# Patient Record
Sex: Female | Born: 1935 | Race: White | Hispanic: No | Smoking: Never smoker
Health system: Southern US, Community
[De-identification: ages and names within clinical notes are randomized; demographics above are authoritative.]

## PROBLEM LIST (undated history)

## (undated) DIAGNOSIS — I10 Essential (primary) hypertension: Secondary | ICD-10-CM

## (undated) DIAGNOSIS — F0390 Unspecified dementia without behavioral disturbance: Secondary | ICD-10-CM

## (undated) DIAGNOSIS — I509 Heart failure, unspecified: Secondary | ICD-10-CM

---

## 1999-09-26 ENCOUNTER — Encounter: Payer: Self-pay | Admitting: Family Medicine

## 1999-09-26 ENCOUNTER — Ambulatory Visit (HOSPITAL_COMMUNITY): Admission: RE | Admit: 1999-09-26 | Discharge: 1999-09-26 | Payer: Self-pay | Admitting: Family Medicine

## 1999-10-06 ENCOUNTER — Encounter: Payer: Self-pay | Admitting: Neurosurgery

## 1999-10-07 ENCOUNTER — Encounter: Payer: Self-pay | Admitting: Neurosurgery

## 1999-10-07 ENCOUNTER — Inpatient Hospital Stay (HOSPITAL_COMMUNITY): Admission: RE | Admit: 1999-10-07 | Discharge: 1999-10-08 | Payer: Self-pay | Admitting: Neurosurgery

## 2002-04-21 ENCOUNTER — Ambulatory Visit (HOSPITAL_BASED_OUTPATIENT_CLINIC_OR_DEPARTMENT_OTHER): Admission: RE | Admit: 2002-04-21 | Discharge: 2002-04-21 | Payer: Self-pay | Admitting: Neurology

## 2003-12-21 ENCOUNTER — Ambulatory Visit: Payer: Self-pay | Admitting: Unknown Physician Specialty

## 2004-01-06 ENCOUNTER — Other Ambulatory Visit: Payer: Self-pay

## 2004-02-23 ENCOUNTER — Inpatient Hospital Stay: Payer: Self-pay | Admitting: Unknown Physician Specialty

## 2004-03-03 ENCOUNTER — Encounter: Payer: Self-pay | Admitting: Internal Medicine

## 2004-03-06 ENCOUNTER — Emergency Department: Payer: Self-pay | Admitting: Emergency Medicine

## 2004-03-06 ENCOUNTER — Other Ambulatory Visit: Payer: Self-pay

## 2004-03-20 ENCOUNTER — Encounter: Payer: Self-pay | Admitting: Internal Medicine

## 2004-04-20 ENCOUNTER — Encounter: Payer: Self-pay | Admitting: Internal Medicine

## 2004-05-06 ENCOUNTER — Encounter: Payer: Self-pay | Admitting: Unknown Physician Specialty

## 2004-05-18 ENCOUNTER — Encounter: Payer: Self-pay | Admitting: Unknown Physician Specialty

## 2004-06-18 ENCOUNTER — Encounter: Payer: Self-pay | Admitting: Unknown Physician Specialty

## 2004-07-18 ENCOUNTER — Encounter: Payer: Self-pay | Admitting: Unknown Physician Specialty

## 2004-10-25 ENCOUNTER — Ambulatory Visit: Payer: Self-pay | Admitting: Family Medicine

## 2006-02-13 ENCOUNTER — Ambulatory Visit: Payer: Self-pay

## 2006-09-24 ENCOUNTER — Other Ambulatory Visit: Payer: Self-pay

## 2006-09-24 ENCOUNTER — Ambulatory Visit: Payer: Self-pay | Admitting: Unknown Physician Specialty

## 2006-10-09 ENCOUNTER — Inpatient Hospital Stay: Payer: Self-pay | Admitting: Unknown Physician Specialty

## 2006-10-13 ENCOUNTER — Encounter: Payer: Self-pay | Admitting: Internal Medicine

## 2006-10-19 ENCOUNTER — Encounter: Payer: Self-pay | Admitting: Internal Medicine

## 2006-11-20 ENCOUNTER — Encounter: Payer: Self-pay | Admitting: Unknown Physician Specialty

## 2006-12-19 ENCOUNTER — Encounter: Payer: Self-pay | Admitting: Unknown Physician Specialty

## 2007-05-28 ENCOUNTER — Ambulatory Visit: Payer: Self-pay

## 2007-09-14 ENCOUNTER — Emergency Department: Payer: Self-pay | Admitting: Emergency Medicine

## 2008-03-26 IMAGING — CR DG KNEE 1-2V*L*
1 series · 2 of 2 positions shown · non-contrast
Comparison: none

REASON FOR EXAM: Post-op TKR
COMMENTS:   Bedside (portable):Y

PROCEDURE:     DXR - DXR KNEE LEFT AP AND LATERAL  - October 09, 2006  [DATE]
RESULT:     The patient is status post LEFT knee replacement. No fracture
about the femoral or tibial prosthetic components is seen. There is no
dislocation at the prosthetic knee joint. Surgical drains are present.

[Series 1: view not recorded · 0.17mm/px · 2 of 2 slices shown]
[im 1/2]
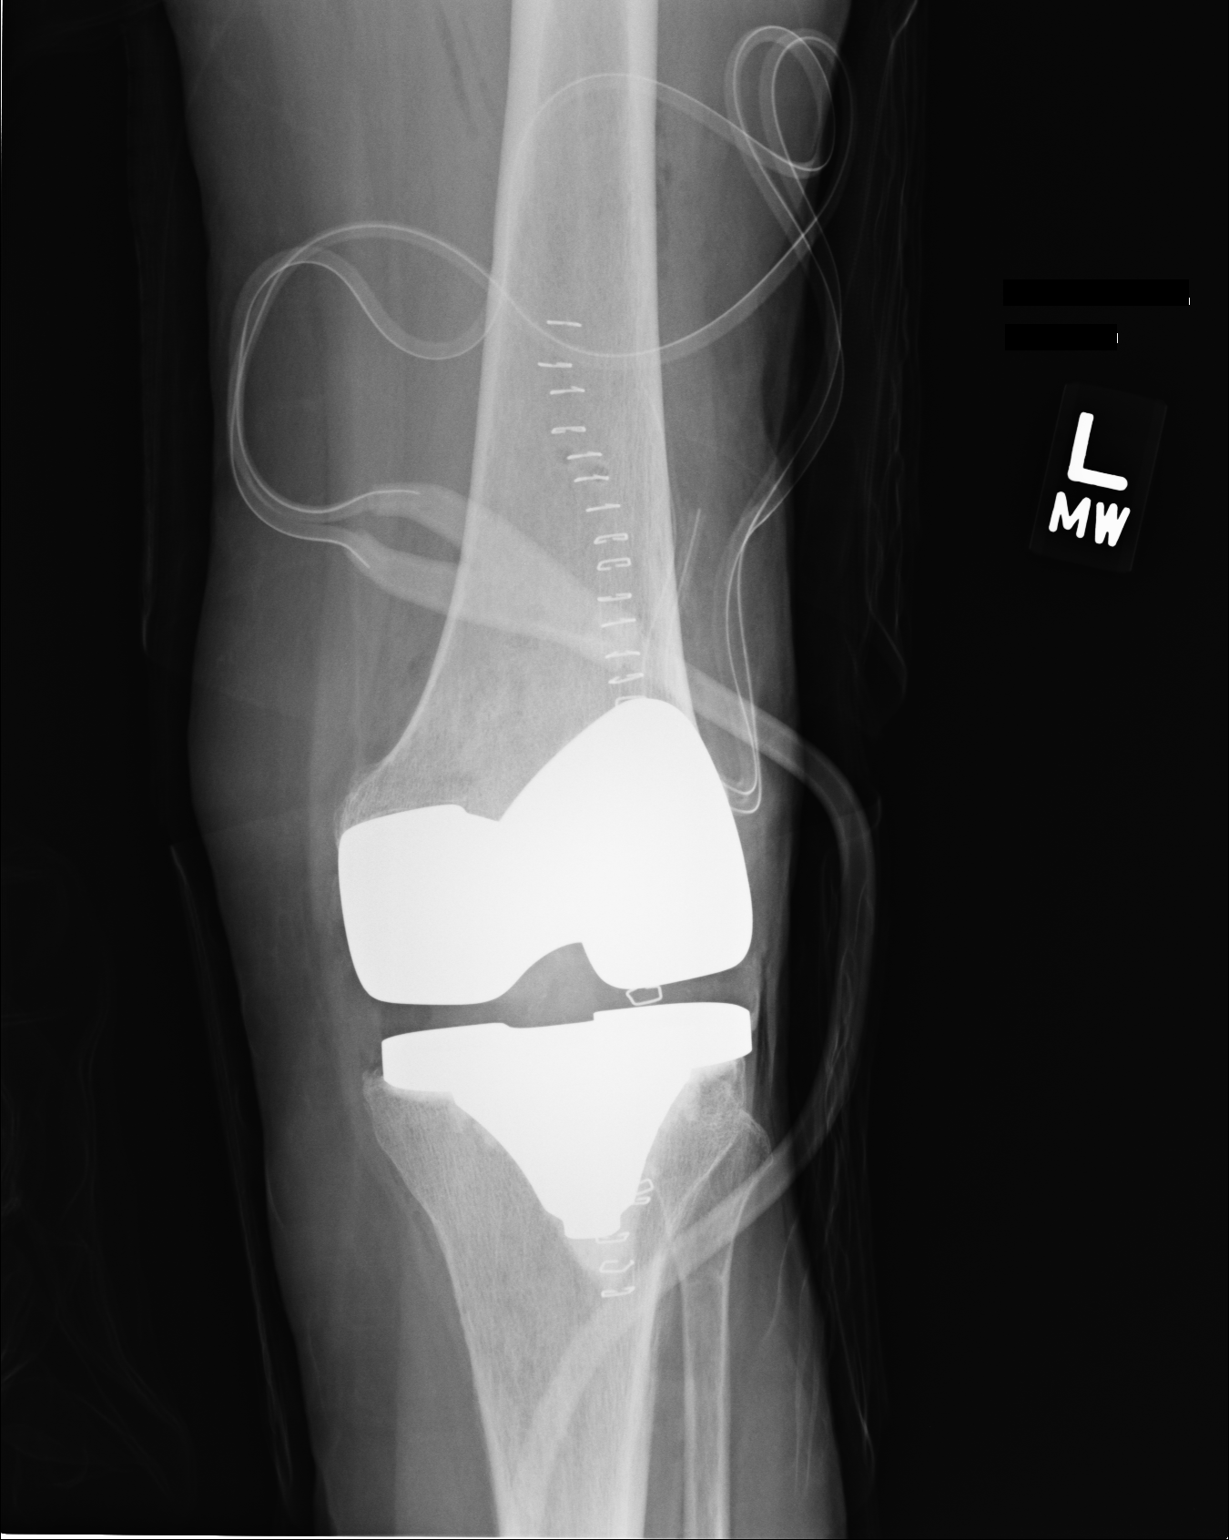
[im 2/2]
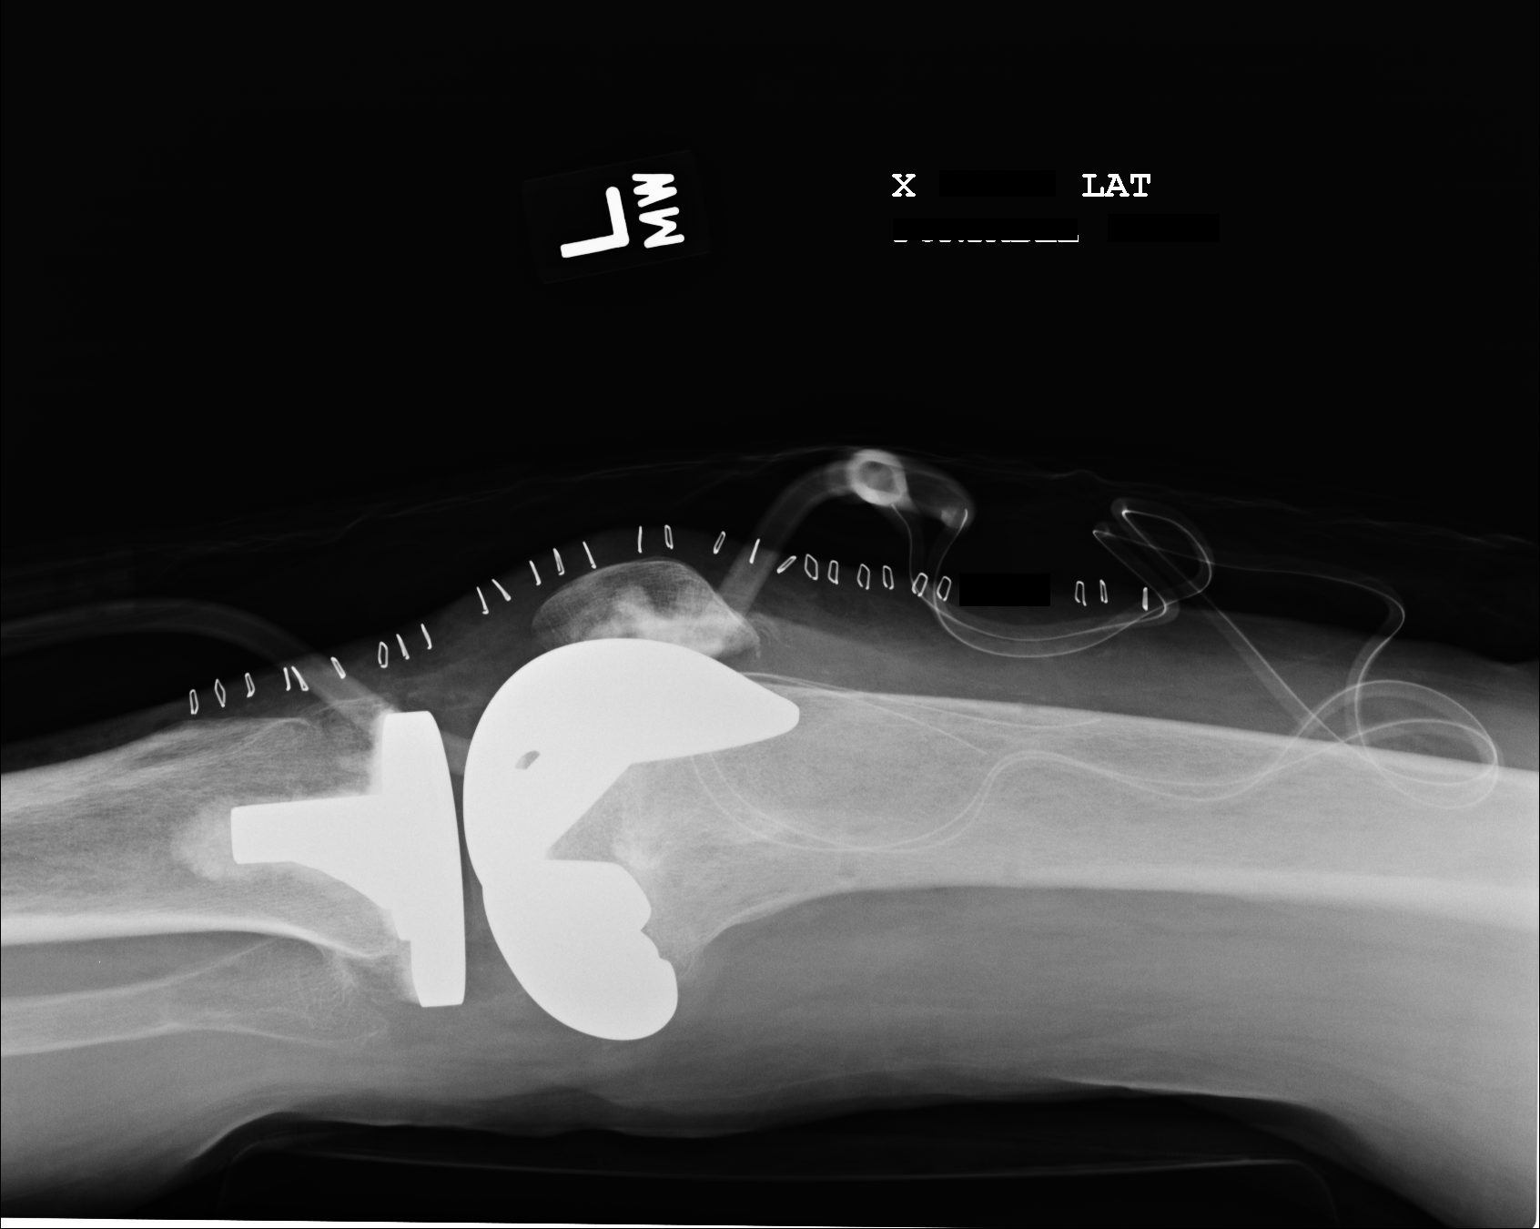

[2 of 2 positions shown; findings below may reference images not displayed]

IMPRESSION: The patient is status post LEFT knee replacement. No
abnormal post-operative changes are identified.

## 2009-10-04 ENCOUNTER — Emergency Department: Payer: Self-pay | Admitting: Emergency Medicine

## 2009-12-19 ENCOUNTER — Emergency Department: Payer: Self-pay | Admitting: Internal Medicine

## 2010-05-04 ENCOUNTER — Ambulatory Visit: Payer: Self-pay | Admitting: Ophthalmology

## 2010-06-29 ENCOUNTER — Ambulatory Visit: Payer: Self-pay | Admitting: Ophthalmology

## 2010-09-27 ENCOUNTER — Ambulatory Visit: Payer: Self-pay | Admitting: Family Medicine

## 2011-02-13 ENCOUNTER — Ambulatory Visit: Payer: Self-pay | Admitting: Family Medicine

## 2011-02-17 ENCOUNTER — Emergency Department: Payer: Self-pay | Admitting: Internal Medicine

## 2011-02-20 ENCOUNTER — Ambulatory Visit: Payer: Self-pay | Admitting: Surgery

## 2011-02-20 ENCOUNTER — Inpatient Hospital Stay: Payer: Self-pay | Admitting: Internal Medicine

## 2011-02-24 ENCOUNTER — Encounter: Payer: Self-pay | Admitting: Internal Medicine

## 2011-03-30 ENCOUNTER — Inpatient Hospital Stay: Payer: Self-pay | Admitting: Internal Medicine

## 2011-03-30 LAB — URINALYSIS, COMPLETE
Bilirubin,UR: NEGATIVE
Blood: NEGATIVE
Ph: 6 (ref 4.5–8.0)
RBC,UR: 2 /HPF (ref 0–5)
Squamous Epithelial: <1

## 2011-03-30 LAB — COMPREHENSIVE METABOLIC PANEL
Albumin: 3.1 g/dL — ABNORMAL LOW (ref 3.4–5.0)
Alkaline Phosphatase: 48 U/L — ABNORMAL LOW (ref 50–136)
Anion Gap: 8 (ref 7–16)
BUN: 9 mg/dL (ref 7–18)
Calcium, Total: 9 mg/dL (ref 8.5–10.1)
Chloride: 94 mmol/L — ABNORMAL LOW (ref 98–107)
Creatinine: 0.66 mg/dL (ref 0.60–1.30)
EGFR (African American): >60
Glucose: 114 mg/dL — ABNORMAL HIGH (ref 65–99)
Osmolality: 273 (ref 275–301)
Potassium: 3.6 mmol/L (ref 3.5–5.1)
SGOT(AST): 12 U/L — ABNORMAL LOW (ref 15–37)
SGPT (ALT): 12 U/L
Sodium: 137 mmol/L (ref 136–145)
Total Protein: 6.9 g/dL (ref 6.4–8.2)

## 2011-03-30 LAB — CK TOTAL AND CKMB (NOT AT ARMC)
CK, Total: 39 U/L (ref 21–215)
CK-MB: <0.5 ng/mL — ABNORMAL LOW (ref 0.5–3.6)

## 2011-03-30 LAB — CBC
HCT: 32.7 % — ABNORMAL LOW (ref 35.0–47.0)
HGB: 11 g/dL — ABNORMAL LOW (ref 12.0–16.0)
MCH: 29.4 pg (ref 26.0–34.0)
MCHC: 33.6 g/dL (ref 32.0–36.0)
MCV: 88 fL (ref 80–100)
Platelet: 201 10*3/uL (ref 150–440)

## 2011-03-30 LAB — TROPONIN I: Troponin-I: <0.02 ng/mL

## 2011-03-30 LAB — TSH: Thyroid Stimulating Horm: 1.88 u[IU]/mL

## 2011-03-30 LAB — FOLATE: Folic Acid: 44.5 ng/mL (ref 3.1–100.0)

## 2011-03-31 LAB — BASIC METABOLIC PANEL
Anion Gap: 7 (ref 7–16)
Chloride: 100 mmol/L (ref 98–107)
Co2: 32 mmol/L (ref 21–32)
EGFR (African American): >60
Potassium: 3.7 mmol/L (ref 3.5–5.1)
Sodium: 139 mmol/L (ref 136–145)

## 2011-03-31 LAB — CBC WITH DIFFERENTIAL/PLATELET
Basophil #: 0 10*3/uL (ref 0.0–0.1)
Eosinophil #: 0.2 10*3/uL (ref 0.0–0.7)
Eosinophil %: 2.9 %
Lymphocyte #: 0.9 10*3/uL — ABNORMAL LOW (ref 1.0–3.6)
MCH: 28.8 pg (ref 26.0–34.0)
Monocyte #: 0.9 10*3/uL — ABNORMAL HIGH (ref 0.0–0.7)
Neutrophil %: 74 %
Platelet: 184 10*3/uL (ref 150–440)
RBC: 3.33 10*6/uL — ABNORMAL LOW (ref 3.80–5.20)
RDW: 13.7 % (ref 11.5–14.5)

## 2011-03-31 LAB — OCCULT BLOOD X 1 CARD TO LAB, STOOL: Occult Blood, Feces: NEGATIVE

## 2011-03-31 LAB — IRON AND TIBC
Iron Bind.Cap.(Total): 212 ug/dL — ABNORMAL LOW (ref 250–450)
Iron: 11 ug/dL — ABNORMAL LOW (ref 50–170)

## 2011-04-01 LAB — URINE CULTURE

## 2011-04-02 LAB — MAGNESIUM: Magnesium: 1.8 mg/dL

## 2011-04-05 LAB — CULTURE, BLOOD (SINGLE)

## 2011-10-04 ENCOUNTER — Emergency Department: Payer: Self-pay | Admitting: *Deleted

## 2011-10-04 LAB — URINALYSIS, COMPLETE
Bacteria: NONE SEEN
Blood: NEGATIVE
Ketone: NEGATIVE
Ph: 5 (ref 4.5–8.0)
Protein: NEGATIVE
RBC,UR: 3 /HPF (ref 0–5)
Specific Gravity: 1.03 (ref 1.003–1.030)
WBC UR: 1 /HPF (ref 0–5)

## 2011-10-04 LAB — CBC
HCT: 41 % (ref 35.0–47.0)
MCHC: 32.1 g/dL (ref 32.0–36.0)
Platelet: 147 10*3/uL — ABNORMAL LOW (ref 150–440)
RBC: 4.61 10*6/uL (ref 3.80–5.20)
RDW: 14.4 % (ref 11.5–14.5)
WBC: 5.9 10*3/uL (ref 3.6–11.0)

## 2011-10-04 LAB — COMPREHENSIVE METABOLIC PANEL
BUN: 10 mg/dL (ref 7–18)
Chloride: 102 mmol/L (ref 98–107)
EGFR (African American): >60
EGFR (Non-African Amer.): >60
SGOT(AST): 12 U/L — ABNORMAL LOW (ref 15–37)
SGPT (ALT): 17 U/L
Sodium: 139 mmol/L (ref 136–145)
Total Protein: 7.1 g/dL (ref 6.4–8.2)

## 2011-10-04 LAB — PROTIME-INR
INR: 0.9
Prothrombin Time: 12.5 secs (ref 11.5–14.7)

## 2011-10-04 LAB — TROPONIN I: Troponin-I: <0.02 ng/mL

## 2011-10-26 ENCOUNTER — Inpatient Hospital Stay: Payer: Self-pay | Admitting: Internal Medicine

## 2011-10-26 LAB — COMPREHENSIVE METABOLIC PANEL
Albumin: 3.5 g/dL (ref 3.4–5.0)
Alkaline Phosphatase: 63 U/L (ref 50–136)
Anion Gap: 5 — ABNORMAL LOW (ref 7–16)
BUN: 12 mg/dL (ref 7–18)
Bilirubin,Total: 0.4 mg/dL (ref 0.2–1.0)
Creatinine: 0.8 mg/dL (ref 0.60–1.30)
Glucose: 131 mg/dL — ABNORMAL HIGH (ref 65–99)
SGOT(AST): 16 U/L (ref 15–37)
SGPT (ALT): 16 U/L (ref 12–78)
Sodium: 143 mmol/L (ref 136–145)
Total Protein: 6.5 g/dL (ref 6.4–8.2)

## 2011-10-26 LAB — TROPONIN I: Troponin-I: <0.02 ng/mL

## 2011-10-26 LAB — CBC
HCT: 37.7 % (ref 35.0–47.0)
HGB: 12.9 g/dL (ref 12.0–16.0)
MCV: 88 fL (ref 80–100)
RBC: 4.28 10*6/uL (ref 3.80–5.20)
WBC: 5 10*3/uL (ref 3.6–11.0)

## 2011-10-26 LAB — URINALYSIS, COMPLETE
Bacteria: NONE SEEN
Bilirubin,UR: NEGATIVE
Blood: NEGATIVE
Glucose,UR: NEGATIVE mg/dL (ref 0–75)
Ketone: NEGATIVE
Ph: 8 (ref 4.5–8.0)
Specific Gravity: 1.01 (ref 1.003–1.030)
Squamous Epithelial: 1

## 2011-10-27 LAB — CBC WITH DIFFERENTIAL/PLATELET
Basophil %: 0.3 %
Eosinophil %: 3.8 %
HCT: 35.4 % (ref 35.0–47.0)
Lymphocyte #: 1.5 10*3/uL (ref 1.0–3.6)
MCH: 30 pg (ref 26.0–34.0)
MCV: 88 fL (ref 80–100)
Monocyte #: 0.5 x10 3/mm (ref 0.2–0.9)
Monocyte %: 9.6 %
Platelet: 130 10*3/uL — ABNORMAL LOW (ref 150–440)
RBC: 4.01 10*6/uL (ref 3.80–5.20)
RDW: 14.1 % (ref 11.5–14.5)

## 2011-10-28 ENCOUNTER — Ambulatory Visit: Payer: Self-pay | Admitting: Neurology

## 2011-10-29 LAB — CBC WITH DIFFERENTIAL/PLATELET
Basophil #: 0 10*3/uL (ref 0.0–0.1)
Eosinophil %: 4 %
HCT: 39.2 % (ref 35.0–47.0)
HGB: 13.2 g/dL (ref 12.0–16.0)
Lymphocyte #: 2 10*3/uL (ref 1.0–3.6)
MCH: 29.7 pg (ref 26.0–34.0)
MCV: 88 fL (ref 80–100)
Monocyte #: 0.5 x10 3/mm (ref 0.2–0.9)
Monocyte %: 8 %
Neutrophil #: 4 10*3/uL (ref 1.4–6.5)
RBC: 4.44 10*6/uL (ref 3.80–5.20)
RDW: 14.1 % (ref 11.5–14.5)
WBC: 6.8 10*3/uL (ref 3.6–11.0)

## 2011-12-15 ENCOUNTER — Observation Stay: Payer: Self-pay | Admitting: Internal Medicine

## 2011-12-15 LAB — COMPREHENSIVE METABOLIC PANEL
Albumin: 3.4 g/dL (ref 3.4–5.0)
Alkaline Phosphatase: 63 U/L (ref 50–136)
BUN: 17 mg/dL (ref 7–18)
Bilirubin,Total: 0.4 mg/dL (ref 0.2–1.0)
Co2: 33 mmol/L — ABNORMAL HIGH (ref 21–32)
Creatinine: 0.74 mg/dL (ref 0.60–1.30)
EGFR (Non-African Amer.): >60
Glucose: 104 mg/dL — ABNORMAL HIGH (ref 65–99)
SGPT (ALT): 21 U/L (ref 12–78)
Total Protein: 6.5 g/dL (ref 6.4–8.2)

## 2011-12-15 LAB — URINALYSIS, COMPLETE
Ketone: NEGATIVE
Nitrite: NEGATIVE
Ph: 7 (ref 4.5–8.0)
Protein: NEGATIVE
RBC,UR: 17 /HPF (ref 0–5)
Specific Gravity: 1.024 (ref 1.003–1.030)
WBC UR: 48 /HPF (ref 0–5)

## 2011-12-15 LAB — PRO B NATRIURETIC PEPTIDE: B-Type Natriuretic Peptide: 622 pg/mL — ABNORMAL HIGH (ref 0–450)

## 2011-12-15 LAB — TROPONIN I
Troponin-I: <0.02 ng/mL
Troponin-I: <0.02 ng/mL

## 2011-12-15 LAB — CBC
HGB: 12.8 g/dL (ref 12.0–16.0)
MCH: 29.1 pg (ref 26.0–34.0)
MCHC: 32.9 g/dL (ref 32.0–36.0)
RDW: 13.6 % (ref 11.5–14.5)
WBC: 6.2 10*3/uL (ref 3.6–11.0)

## 2011-12-16 LAB — LIPID PANEL
HDL Cholesterol: 48 mg/dL (ref 40–60)
Ldl Cholesterol, Calc: 64 mg/dL (ref 0–100)
Triglycerides: 82 mg/dL (ref 0–200)
VLDL Cholesterol, Calc: 16 mg/dL (ref 5–40)

## 2011-12-16 LAB — BASIC METABOLIC PANEL
BUN: 18 mg/dL (ref 7–18)
Calcium, Total: 9.4 mg/dL (ref 8.5–10.1)
Chloride: 105 mmol/L (ref 98–107)
Osmolality: 287 (ref 275–301)
Potassium: 4 mmol/L (ref 3.5–5.1)
Sodium: 143 mmol/L (ref 136–145)

## 2011-12-16 LAB — CBC WITH DIFFERENTIAL/PLATELET
Basophil %: 0.6 %
Eosinophil #: 0.3 10*3/uL (ref 0.0–0.7)
Eosinophil %: 6 %
HGB: 12.2 g/dL (ref 12.0–16.0)
Lymphocyte #: 1.8 10*3/uL (ref 1.0–3.6)
Lymphocyte %: 34.1 %
MCV: 88 fL (ref 80–100)
Monocyte #: 0.5 x10 3/mm (ref 0.2–0.9)
Neutrophil %: 50.5 %
RBC: 4.12 10*6/uL (ref 3.80–5.20)
WBC: 5.3 10*3/uL (ref 3.6–11.0)

## 2011-12-16 LAB — URINE CULTURE

## 2011-12-16 LAB — TROPONIN I: Troponin-I: <0.02 ng/mL

## 2012-08-05 ENCOUNTER — Emergency Department: Payer: Self-pay | Admitting: Emergency Medicine

## 2012-08-05 LAB — COMPREHENSIVE METABOLIC PANEL
Alkaline Phosphatase: 59 U/L (ref 50–136)
Anion Gap: 8 (ref 7–16)
BUN: 19 mg/dL — ABNORMAL HIGH (ref 7–18)
Calcium, Total: 9.9 mg/dL (ref 8.5–10.1)
Co2: 27 mmol/L (ref 21–32)
EGFR (African American): 52 — ABNORMAL LOW
Glucose: 126 mg/dL — ABNORMAL HIGH (ref 65–99)
SGOT(AST): 20 U/L (ref 15–37)
SGPT (ALT): 20 U/L (ref 12–78)
Sodium: 137 mmol/L (ref 136–145)
Total Protein: 7 g/dL (ref 6.4–8.2)

## 2012-08-05 LAB — URINALYSIS, COMPLETE
Glucose,UR: NEGATIVE mg/dL (ref 0–75)
Ketone: NEGATIVE
Leukocyte Esterase: NEGATIVE
Ph: 6 (ref 4.5–8.0)
Protein: NEGATIVE
RBC,UR: 2 /HPF (ref 0–5)
Specific Gravity: 1.019 (ref 1.003–1.030)
Squamous Epithelial: <1
WBC UR: 1 /HPF (ref 0–5)

## 2012-08-05 LAB — CBC
HCT: 39.1 % (ref 35.0–47.0)
HGB: 12.8 g/dL (ref 12.0–16.0)
MCH: 28.3 pg (ref 26.0–34.0)
MCV: 87 fL (ref 80–100)
Platelet: 198 10*3/uL (ref 150–440)
RBC: 4.52 10*6/uL (ref 3.80–5.20)
RDW: 13.5 % (ref 11.5–14.5)
WBC: 6.6 10*3/uL (ref 3.6–11.0)

## 2012-09-10 ENCOUNTER — Emergency Department: Payer: Self-pay | Admitting: Emergency Medicine

## 2012-09-10 LAB — URINALYSIS, COMPLETE
Blood: NEGATIVE
Glucose,UR: NEGATIVE mg/dL (ref 0–75)
Hyaline Cast: 1
Ketone: NEGATIVE
Leukocyte Esterase: NEGATIVE
Ph: 7 (ref 4.5–8.0)
Protein: NEGATIVE
RBC,UR: 2 /HPF (ref 0–5)
Specific Gravity: 1.018 (ref 1.003–1.030)
Squamous Epithelial: <1
WBC UR: 1 /HPF (ref 0–5)

## 2012-09-10 LAB — COMPREHENSIVE METABOLIC PANEL
Albumin: 3.6 g/dL (ref 3.4–5.0)
Alkaline Phosphatase: 54 U/L (ref 50–136)
Anion Gap: 3 — ABNORMAL LOW (ref 7–16)
BUN: 11 mg/dL (ref 7–18)
Calcium, Total: 10 mg/dL (ref 8.5–10.1)
Co2: 33 mmol/L — ABNORMAL HIGH (ref 21–32)
Creatinine: 0.75 mg/dL (ref 0.60–1.30)
Glucose: 103 mg/dL — ABNORMAL HIGH (ref 65–99)
Osmolality: 285 (ref 275–301)
Potassium: 4 mmol/L (ref 3.5–5.1)
SGOT(AST): 12 U/L — ABNORMAL LOW (ref 15–37)
SGPT (ALT): 18 U/L (ref 12–78)
Total Protein: 6.6 g/dL (ref 6.4–8.2)

## 2012-09-10 LAB — CBC
HCT: 37.3 % (ref 35.0–47.0)
HGB: 12.6 g/dL (ref 12.0–16.0)
MCH: 29 pg (ref 26.0–34.0)
MCV: 86 fL (ref 80–100)
Platelet: 180 10*3/uL (ref 150–440)
RBC: 4.34 10*6/uL (ref 3.80–5.20)
RDW: 14.3 % (ref 11.5–14.5)
WBC: 4.3 10*3/uL (ref 3.6–11.0)

## 2013-04-15 ENCOUNTER — Encounter (HOSPITAL_COMMUNITY): Payer: Self-pay | Admitting: *Deleted

## 2013-04-15 ENCOUNTER — Emergency Department: Payer: Self-pay | Admitting: Emergency Medicine

## 2013-04-15 ENCOUNTER — Inpatient Hospital Stay (HOSPITAL_COMMUNITY)
Admission: AD | Admit: 2013-04-15 | Discharge: 2013-04-20 | DRG: 100 | Disposition: A | Discharge status: Skilled Nursing Facility | Payer: Medicare Other | Source: Other Acute Inpatient Hospital | Attending: Internal Medicine | Admitting: Internal Medicine

## 2013-04-15 ENCOUNTER — Telehealth: Payer: Self-pay | Admitting: Family Medicine

## 2013-04-15 DIAGNOSIS — S065XAA Traumatic subdural hemorrhage with loss of consciousness status unknown, initial encounter: Secondary | ICD-10-CM | POA: Diagnosis present

## 2013-04-15 DIAGNOSIS — N390 Urinary tract infection, site not specified: Secondary | ICD-10-CM | POA: Diagnosis not present

## 2013-04-15 DIAGNOSIS — I10 Essential (primary) hypertension: Secondary | ICD-10-CM | POA: Diagnosis present

## 2013-04-15 DIAGNOSIS — G9340 Encephalopathy, unspecified: Secondary | ICD-10-CM | POA: Diagnosis present

## 2013-04-15 DIAGNOSIS — I509 Heart failure, unspecified: Secondary | ICD-10-CM | POA: Diagnosis present

## 2013-04-15 DIAGNOSIS — Z66 Do not resuscitate: Secondary | ICD-10-CM | POA: Diagnosis present

## 2013-04-15 DIAGNOSIS — S065X9A Traumatic subdural hemorrhage with loss of consciousness of unspecified duration, initial encounter: Secondary | ICD-10-CM

## 2013-04-15 DIAGNOSIS — Z9181 History of falling: Secondary | ICD-10-CM

## 2013-04-15 DIAGNOSIS — R55 Syncope and collapse: Secondary | ICD-10-CM | POA: Diagnosis present

## 2013-04-15 DIAGNOSIS — R4182 Altered mental status, unspecified: Secondary | ICD-10-CM

## 2013-04-15 DIAGNOSIS — M216X9 Other acquired deformities of unspecified foot: Secondary | ICD-10-CM | POA: Diagnosis present

## 2013-04-15 DIAGNOSIS — R569 Unspecified convulsions: Principal | ICD-10-CM | POA: Diagnosis present

## 2013-04-15 DIAGNOSIS — Z8673 Personal history of transient ischemic attack (TIA), and cerebral infarction without residual deficits: Secondary | ICD-10-CM

## 2013-04-15 DIAGNOSIS — F0390 Unspecified dementia without behavioral disturbance: Secondary | ICD-10-CM | POA: Diagnosis present

## 2013-04-15 HISTORY — DX: Unspecified dementia, unspecified severity, without behavioral disturbance, psychotic disturbance, mood disturbance, and anxiety: F03.90

## 2013-04-15 HISTORY — DX: Essential (primary) hypertension: I10

## 2013-04-15 HISTORY — DX: Heart failure, unspecified: I50.9

## 2013-04-15 LAB — URINALYSIS, COMPLETE
BACTERIA: NONE SEEN
Bilirubin,UR: NEGATIVE
Glucose,UR: NEGATIVE mg/dL (ref 0–75)
Ketone: NEGATIVE
Leukocyte Esterase: NEGATIVE
Nitrite: NEGATIVE
PH: 6 (ref 4.5–8.0)
Protein: NEGATIVE
SPECIFIC GRAVITY: 1.019 (ref 1.003–1.030)

## 2013-04-15 LAB — COMPREHENSIVE METABOLIC PANEL
ALK PHOS: 57 U/L
ALT: 15 U/L (ref 12–78)
Albumin: 3.6 g/dL (ref 3.4–5.0)
Anion Gap: 4 — ABNORMAL LOW (ref 7–16)
BUN: 17 mg/dL (ref 7–18)
Bilirubin,Total: 0.4 mg/dL (ref 0.2–1.0)
CO2: 31 mmol/L (ref 21–32)
CREATININE: 0.73 mg/dL (ref 0.60–1.30)
Calcium, Total: 9.4 mg/dL (ref 8.5–10.1)
Chloride: 103 mmol/L (ref 98–107)
EGFR (African American): >60
EGFR (Non-African Amer.): >60
Glucose: 103 mg/dL — ABNORMAL HIGH (ref 65–99)
Osmolality: 277 (ref 275–301)
Potassium: 3.8 mmol/L (ref 3.5–5.1)
SGOT(AST): 20 U/L (ref 15–37)
SODIUM: 138 mmol/L (ref 136–145)
Total Protein: 6.6 g/dL (ref 6.4–8.2)

## 2013-04-15 LAB — CBC
HCT: 36.1 % (ref 35.0–47.0)
HCT: 34.7 % — ABNORMAL LOW (ref 36.0–46.0)
HGB: 11.7 g/dL — ABNORMAL LOW (ref 12.0–16.0)
Hemoglobin: 10.9 g/dL — ABNORMAL LOW (ref 12.0–15.0)
MCH: 26.5 pg (ref 26.0–34.0)
MCH: 26.2 pg (ref 26.0–34.0)
MCHC: 32.5 g/dL (ref 32.0–36.0)
MCHC: 31.4 g/dL (ref 30.0–36.0)
MCV: 82 fL (ref 80–100)
MCV: 83.4 fL (ref 78.0–100.0)
Platelet: 129 10*3/uL — ABNORMAL LOW (ref 150–440)
Platelets: 148 10*3/uL — ABNORMAL LOW (ref 150–400)
RBC: 4.42 10*6/uL (ref 3.80–5.20)
RBC: 4.16 MIL/uL (ref 3.87–5.11)
RDW: 14.6 % — AB (ref 11.5–14.5)
RDW: 14 % (ref 11.5–15.5)
WBC: 6 10*3/uL (ref 3.6–11.0)
WBC: 6.7 10*3/uL (ref 4.0–10.5)

## 2013-04-15 LAB — CREATININE, SERUM
Creatinine, Ser: 0.62 mg/dL (ref 0.50–1.10)
GFR calc Af Amer: >90 mL/min (ref >90)
GFR, EST NON AFRICAN AMERICAN: 82 mL/min — AB (ref >90)

## 2013-04-15 LAB — TROPONIN I: Troponin-I: <0.02 ng/mL

## 2013-04-15 MED ORDER — NITROGLYCERIN 0.4 MG SL SUBL: 0.4000 mg | SUBLINGUAL_TABLET | SUBLINGUAL | Status: DC | PRN

## 2013-04-15 MED ORDER — ADULT MULTIVITAMIN W/MINERALS CH
1.0000 | ORAL_TABLET | Freq: Every day | ORAL | Status: DC
  Administered 2013-04-16 – 2013-04-20 (×5): 1 via ORAL
  Filled 2013-04-15 (×5): qty 1

## 2013-04-15 MED ORDER — FUROSEMIDE 20 MG PO TABS
20.0000 mg | ORAL_TABLET | Freq: Every day | ORAL | Status: DC | PRN
  Administered 2013-04-17: 20 mg via ORAL
  Filled 2013-04-15: qty 1

## 2013-04-15 MED ORDER — CLOPIDOGREL BISULFATE 75 MG PO TABS
75.0000 mg | ORAL_TABLET | Freq: Every day | ORAL | Status: DC
  Administered 2013-04-16 – 2013-04-20 (×5): 75 mg via ORAL
  Filled 2013-04-15 (×7): qty 1

## 2013-04-15 MED ORDER — TRAMADOL HCL 50 MG PO TABS
50.0000 mg | ORAL_TABLET | Freq: Two times a day (BID) | ORAL | Status: DC
  Administered 2013-04-15 – 2013-04-20 (×9): 50 mg via ORAL
  Filled 2013-04-15 (×11): qty 1

## 2013-04-15 MED ORDER — DULOXETINE HCL 30 MG PO CPEP
30.0000 mg | ORAL_CAPSULE | Freq: Every day | ORAL | Status: DC
  Administered 2013-04-16 – 2013-04-20 (×5): 30 mg via ORAL
  Filled 2013-04-15 (×5): qty 1

## 2013-04-15 MED ORDER — PANTOPRAZOLE SODIUM 40 MG PO TBEC
40.0000 mg | DELAYED_RELEASE_TABLET | Freq: Every day | ORAL | Status: DC
  Administered 2013-04-16 – 2013-04-20 (×5): 40 mg via ORAL
  Filled 2013-04-15 (×3): qty 1

## 2013-04-15 MED ORDER — METOPROLOL SUCCINATE ER 25 MG PO TB24
25.0000 mg | ORAL_TABLET | Freq: Every day | ORAL | Status: DC
  Administered 2013-04-16 – 2013-04-20 (×4): 25 mg via ORAL
  Filled 2013-04-15 (×5): qty 1

## 2013-04-15 MED ORDER — SENNOSIDES-DOCUSATE SODIUM 8.6-50 MG PO TABS: 1.0000 | ORAL_TABLET | Freq: Every evening | ORAL | Status: DC | PRN

## 2013-04-15 MED ORDER — ATORVASTATIN CALCIUM 80 MG PO TABS
80.0000 mg | ORAL_TABLET | Freq: Every day | ORAL | Status: DC
  Administered 2013-04-16 – 2013-04-20 (×5): 80 mg via ORAL
  Filled 2013-04-15 (×5): qty 1

## 2013-04-15 MED ORDER — ENOXAPARIN SODIUM 30 MG/0.3ML ~~LOC~~ SOLN
30.0000 mg | SUBCUTANEOUS | Status: DC
  Administered 2013-04-15: 30 mg via SUBCUTANEOUS
  Filled 2013-04-15 (×2): qty 0.3

## 2013-04-15 MED ORDER — LISINOPRIL 20 MG PO TABS
20.0000 mg | ORAL_TABLET | Freq: Every day | ORAL | Status: DC
  Administered 2013-04-16 – 2013-04-20 (×4): 20 mg via ORAL
  Filled 2013-04-15 (×5): qty 1

## 2013-04-15 MED ORDER — CALCIUM CARBONATE 1250 (500 CA) MG PO TABS
600.0000 mg | ORAL_TABLET | Freq: Two times a day (BID) | ORAL | Status: DC
  Administered 2013-04-16 (×2): 625 mg via ORAL
  Filled 2013-04-15 (×5): qty 0.5

## 2013-04-15 MED ORDER — VITAMIN D3 25 MCG (1000 UNIT) PO TABS
1000.0000 [IU] | ORAL_TABLET | Freq: Every day | ORAL | Status: DC
  Administered 2013-04-16 – 2013-04-20 (×5): 1000 [IU] via ORAL
  Filled 2013-04-15 (×5): qty 1

## 2013-04-15 MED ORDER — DOCUSATE SODIUM 100 MG PO CAPS: 100.0000 mg | ORAL_CAPSULE | Freq: Two times a day (BID) | ORAL | Status: DC | PRN

## 2013-04-15 NOTE — H&P (Signed)
Hospitalist Admission History and Physical  Patient name: Morgan Jenkins Medical record number: [id number] Date of birth: 08-25-1929 Age: 80 y.o. Gender: female  Primary Care Provider: No primary provider on file.  Chief Complaint: encephalopathy, ?CVA   History of Present Illness:This is a 80 y.o. year old female with significant past medical history of HTN, Dementia, CVA presenting with progressing AMS x 2-3 weeks. Daughter states that pt has been progressively confused since christmas with pt not remembering loved ones and orientation. Has also had recurring falls at home. Does have 24 hour care at home. Today, daughter received call from cargiver about pt being minimally responsive at home. Pt had to be manually moved from bed to wheelchair. Pt was also unable to recognize close family member. EMS was called, per daughter blood sugar and BP WNL.   Pt was sent to [facility] for further evaluation. On presentation, pt was noted to be dysarthric and confused. CBC, CMET, UA and CXR WNL. Had head CT that was negative for any acute intracranial abnormality. Did show tiny subdural hematoma in flax region. Case was discussed with neuro. EDP was concerned about subdural hematoma. Neuro felt sxs likely 2/2 CVA recurrence with need for CVA workup. [facility]. Last fall was 3 days ago per daughter. Currently on full dose ASA. Pt [facility]ive [facility]. Pt currently denies any CP, SOB. Does have some L sided weakness that is fairly chronic. No acute worsening. AxO x3.     Patient Active Problem List   Diagnosis Date Noted  . Encephalopathy 04/15/2013  . Progressive dementia with uncertain etiology 04/15/2013  . Unspecified essential hypertension 04/15/2013  . Syncope 04/15/2013   Past Medical History: Past Medical History  Diagnosis Date  . Hypertension   . CHF (congestive heart failure)   . Dementia     Past Surgical  History: History reviewed. No pertinent past surgical history.  Social History: History   Social History  . Marital Status: Married    Spouse Name: N/A    Number of Children: N/A  . Years of Education: N/A   Social History Main Topics  . Smoking status: Not on file  . Smokeless tobacco: Not on file  . Alcohol Use: Not on file  . Drug Use: Not on file  . Sexual Activity: Not on file   Other Topics Concern  . Not on file   Social History Narrative  . No narrative on file    Family History: No family history on file.  Allergies: Allergies  Allergen Reactions  . Biaxin [Clarithromycin]     unknown  . Ciprofloxacin Rash  . Penicillins Rash    Current Facility-Administered Medications  Medication Dose Route Frequency Provider Last Rate Last Dose  . [START ON 04/16/2013] atorvastatin (LIPITOR) tablet 80 mg  80 mg Oral Daily Doree AlbeeSteven Tianni Escamilla, MD      . Melene Muller[START ON 04/16/2013] calcium carbonate (OS-CAL) tablet 600 mg  600 mg Oral BID WC Doree AlbeeSteven Tonny Isensee, MD      . Melene Muller[START ON 04/16/2013] cholecalciferol (VITAMIN D) tablet 1,000 Units  1,000 Units Oral Daily Doree AlbeeSteven Cobie Marcoux, MD      . Melene Muller[START ON 04/16/2013] clopidogrel (PLAVIX) tablet 75 mg  75 mg Oral Q breakfast Doree AlbeeSteven Lealer Marsland, MD      . docusate sodium (COLACE) capsule 100 mg  100 mg Oral BID PRN Doree AlbeeSteven Avnoor Koury, MD      . Melene Muller[START ON 04/16/2013] DULoxetine (CYMBALTA) DR capsule 30  mg  30 mg Oral Daily Doree Albee, MD      . enoxaparin (LOVENOX) injection 30 mg  30 mg Subcutaneous Q24H Doree Albee, MD      . furosemide (LASIX) tablet 20 mg  20 mg Oral Daily PRN Doree Albee, MD      . Melene Muller ON 04/16/2013] lisinopril (PRINIVIL,ZESTRIL) tablet 20 mg  20 mg Oral Daily Doree Albee, MD      . Melene Muller ON 04/16/2013] metoprolol succinate (TOPROL-XL) 24 hr tablet 25 mg  25 mg Oral Daily Doree Albee, MD      . Melene Muller ON 04/16/2013] multivitamin with minerals tablet 1 tablet  1 tablet Oral Daily Doree Albee, MD      . nitroGLYCERIN (NITROSTAT) SL  tablet 0.4 mg  0.4 mg Sublingual Q5 min PRN Doree Albee, MD      . Melene Muller ON 04/16/2013] pantoprazole (PROTONIX) EC tablet 40 mg  40 mg Oral Daily Doree Albee, MD      . senna-docusate (Senokot-S) tablet 1 tablet  1 tablet Oral QHS PRN Doree Albee, MD      . traMADol Janean Sark) tablet 50 mg  50 mg Oral BID Doree Albee, MD       Review Of Systems: 12 point ROS negative except as noted above in HPI.  Physical Exam: Filed Vitals:   04/15/13 2044  BP: 179/66  Pulse: 85  Temp: 98.1 F (36.7 C)  Resp: 18    General: alert and cooperative HEENT: PERRLA and extra ocular movement intact Heart: S1, S2 normal, no murmur, rub or gallop, regular rate and rhythm Lungs: clear to auscultation, no wheezes or rales and unlabored breathing Abdomen: abdomen is soft without significant tenderness, masses, organomegaly or guarding Extremities: extremities normal, atraumatic, no cyanosis or edema Skin:no rashes Neurology: mental status, speech normal, alert and oriented x3 and baseline mild L sided weakness, exam otherwise WNL  Labs and Imaging: Pending   Assessment and Plan: Morgan Jenkins is a 80 y.o. year old female presenting with encephalopathy, ? CVA, tiny subdural hematoma.   Encephalopathy: Likely multifactorial with contributions of dementia and CVA. Proceed down CVA workup. Neuro consulted. Labs reviewed from [facility]. No clinical signs of infection currently. Follow up pending imaging and blood work.   CVA: Findings of transient aphasia and ? Worsening L sided weakness concerning for CVA recurrence. CVA workup as below. Start on plavix. HgbA1c, fasting lipid panel, MRI, MRA  of the brain without contrast, PT consult, OT consult, Speech consult, Echocardiogram, Carotid dopplers   Subdural hematoma: Noted on imaging from [facility]. 3 mm on measurement. Doubt this is predominant source of sxs. Neuro also aware. Should be able to get a better view of this on MRI. Will follow closely.   HTN:  coninue home meds.    FEN/GI: heart healthy diet pending bedside swallow eval.  Prophylaxis: lovenox.  Disposition: pending further evaluation.  Code Status:DNR       Doree Albee MD  Pager: [phone]

## 2013-04-15 NOTE — Consult Note (Signed)
Reason for Consult: Transient unresponsiveness and small subdural hematoma.  HPI:                                                                                                                                          Morgan Jenkins is an 80 y.o. female history of hypertension, hyperlipidemia, congestive heart failure and progressive dementia who was transferred [facility] room for further management. Patient had an episode of loss of consciousness which lasted for about 2 hours. No seizure activity reported. Patient was unresponsive to external stimuli including noxious stimuli. Speech was noted to be slightly slurred prior to losing consciousness and on waking up. She is back to baseline state at this point according to her daughter. No focal weakness has been noted. Patient has had frequent recurrent falls. She's had a marked decline in cognitive functioning and increase in confusion over the past 5 weeks. Last fall was 3 days ago. CT scan of her head showed a small 3 mm linear subdural hematoma along the falx. Study was otherwise unremarkable for acute changes. Patient is afebrile. Electrolytes were unremarkable. BUN and creatinine was unremarkable, as was urinalysis.  No past medical history on file.  No past surgical history on file.  No family history on file.  Social History:  has no tobacco, alcohol, and drug history on file.  Allergies  Allergen Reactions  . Biaxin [Clarithromycin]     unknown  . Ciprofloxacin Rash  . Penicillins Rash    MEDICATIONS:                                                                                                                     I have reviewed the patient's current medications.   ROS:  History obtained from child and chart review  General ROS: negative for - chills,  fatigue, fever, night sweats, weight gain or weight loss Psychological ROS: Positive for progressive dementia Ophthalmic ROS: negative for - blurry vision, double vision, eye pain or loss of vision ENT ROS: negative for - epistaxis, nasal discharge, oral lesions, sore throat, tinnitus or vertigo Allergy and Immunology ROS: negative for - hives or itchy/watery eyes Hematological and Lymphatic ROS: negative for - bleeding problems, bruising or swollen lymph nodes Endocrine ROS: negative for - galactorrhea, hair pattern changes, polydipsia/polyuria or temperature intolerance Respiratory ROS: negative for - cough, hemoptysis, shortness of breath or wheezing Cardiovascular ROS: negative for - chest pain, dyspnea on exertion, edema or irregular heartbeat Gastrointestinal ROS: negative for - abdominal pain, diarrhea, hematemesis, nausea/vomiting or stool incontinence Genito-Urinary ROS: negative for - dysuria, hematuria, incontinence or urinary frequency/urgency Musculoskeletal ROS: negative for - joint swelling or muscular weakness Neurological ROS: as noted in HPI Dermatological ROS: negative for rash and skin lesion changes   Blood pressure 179/66, pulse 85, temperature 98.1 F (36.7 C), temperature source Oral, resp. rate 18, height 5\' 7"  (1.702 m), weight 74.299 kg (163 lb 12.8 oz), SpO2 97.00%.   Neurologic Examination:                                                                                                      Mental Status: Sleeping but easy to arouse, oriented to place but disoriented to time.  Speech fluent without evidence of aphasia. Able to follow commands without difficulty. Cranial Nerves: II-Visual fields were normal. III/IV/VI-right pupil was slightly larger than left; both pupils reactive to light. Extraocular movements were full and conjugate.    V/VII-no facial numbness and no facial weakness. VIII-normal. X-normal speech. Motor: 5/5 bilaterally with normal tone and  bulk, except for marked left foot drop (chronic). Sensory: Normal throughout. Deep Tendon Reflexes: 1+ and symmetric. Plantars: Mute bilaterally Cerebellar: Normal finger-to-nose testing.  No results found for this basename: cbc, bmp, coags, chol, tri, ldl, hga1c    No results found for this or any previous visit (from the past 48 hour(s)).  No results found.   Assessment/Plan: 80 year old lady with hypertension, hyperlipidemia and congestive heart failure as well as progressive dementia presenting with unresponsiveness for about 2 hours. Etiology is unclear, but most likely a syncopal in etiology. Seizure is less likely, but cannot be completely ruled out. Patient has no clinical signs of acute stroke.  Recommendations: 1. MRI of the brain to rule out acute stroke as well as rule out acute encephalopathic changes. 2. Stroke workup if MRI shows signs of acute stroke. 3. EEG, routine adult. 4. Vitamin B12 and folate levels, RPR and TSH 5. No intervention indicated regarding small subdural hematoma 6. Physical therapy evaluation of patient's gait and recommendations regarding ambulation safety  We will follow this patient with you.  C.R. Roseanne Reno, MD [facility] [phone]  04/15/2013, 10:19 PM

## 2013-04-15 NOTE — Telephone Encounter (Signed)
Pt with AMS/stroke like sxs s/p fall. Pt intermittnently aphasic with some ruminating in ER [facility]. Got head CT which showed 3 mm parafalcine subdural hematoma. Neuro consulted. Feels like sxs are like related to CVA and not subdural hematoma.  Pt not on anticoagulation. Will need CVA workup. [facility] at [facility] concerned about subdural hematoma. [facility] workup and ? obs for hematoma. Spoke with neurologist Dr. Amada JupiterKirkpatrick directly. He has very little concern about 3mm subdural hematoma being related to sxs, but would like for hospitalist team to accept pt with neuro consulting for AMS workup likely related to CVA event. Pt accepted to telemetry bed.

## 2013-04-16 ENCOUNTER — Inpatient Hospital Stay (HOSPITAL_COMMUNITY): Payer: Medicare Other

## 2013-04-16 DIAGNOSIS — I517 Cardiomegaly: Secondary | ICD-10-CM

## 2013-04-16 DIAGNOSIS — G9340 Encephalopathy, unspecified: Secondary | ICD-10-CM

## 2013-04-16 DIAGNOSIS — S065X9A Traumatic subdural hemorrhage with loss of consciousness of unspecified duration, initial encounter: Secondary | ICD-10-CM | POA: Diagnosis present

## 2013-04-16 DIAGNOSIS — I6200 Nontraumatic subdural hemorrhage, unspecified: Secondary | ICD-10-CM

## 2013-04-16 DIAGNOSIS — S065XAA Traumatic subdural hemorrhage with loss of consciousness status unknown, initial encounter: Secondary | ICD-10-CM | POA: Diagnosis present

## 2013-04-16 LAB — HEMOGLOBIN A1C
Hgb A1c MFr Bld: 6.4 % — ABNORMAL HIGH (ref <5.7)
MEAN PLASMA GLUCOSE: 137 mg/dL — AB (ref <117)

## 2013-04-16 MED ORDER — ENOXAPARIN SODIUM 40 MG/0.4ML ~~LOC~~ SOLN
40.0000 mg | Freq: Every day | SUBCUTANEOUS | Status: DC
  Administered 2013-04-16 – 2013-04-19 (×4): 40 mg via SUBCUTANEOUS
  Filled 2013-04-16 (×5): qty 0.4

## 2013-04-16 NOTE — Progress Notes (Signed)
NEURO HOSPITALIST PROGRESS NOTE   SUBJECTIVE:                                                                                                                        Resting comfortable in a chair. Daughter at the bedside. Morgan Jenkins offers no neurological complains but said that her left side hurts. MRI brain today showed no acute ischemic stroke. There is a trace falcine subdural hematoma and punctate foci of susceptibility artifact peripherally within the cerebral hemispheres, most prominently in the right temporoparietal, bilateral frontal, and occipital regions, compatible with remote micro-hemorrhages. MRA brain revealed no evidence of major intracranial arterial occlusion or high-grade stenosis. Mild irregularity of the distal left vertebral artery. EEG pending.   OBJECTIVE:                                                                                                                           Vital signs in last 24 hours: Temp:  [97.5 F (36.4 C)-98.1 F (36.7 C)] 98 F (36.7 C) (01/28 2440) Pulse Rate:  [82-93] 93 (01/28 0938) Resp:  [16-20] 20 (01/28 0938) BP: (139-179)/(47-77) 139/47 mmHg (01/28 0938) SpO2:  [94 %-97 %] 96 % (01/28 0938) Weight:  [74.299 kg (163 lb 12.8 oz)] 74.299 kg (163 lb 12.8 oz) (01/27 2044)  Intake/Output from previous day:   Intake/Output this shift: Total I/O In: 120 [P.O.:120] Out: -  Nutritional status: Cardiac  Past Medical History  Diagnosis Date  . Hypertension   . CHF (congestive heart failure)   . Dementia     Neurologic Exam:  Mental Status:  Alert and awake, oriented to place but disoriented to time. Speech fluent without evidence of aphasia. Able to follow commands without difficulty.  Cranial Nerves:  II-Visual fields were normal.  III/IV/VI-right pupil was slightly larger than left; both pupils reactive to light. Extraocular movements were full and conjugate.  V/VII-no facial  numbness and no facial weakness.  VIII-normal.  X-normal speech.  Motor: 5/5 bilaterally with normal tone and bulk, except for marked left foot drop (chronic).  Sensory: Normal throughout.  Deep Tendon Reflexes: 1+ and symmetric.  Plantars: Mute bilaterally  Cerebellar: Normal finger-to-nose testing.   Lab Results: No results found for this basename:  cbc, bmp, coags, chol, tri, ldl, hga1c   Lipid Panel No results found for this basename: CHOL, TRIG, HDL, CHOLHDL, VLDL, LDLCALC,  in the last 72 hours  Studies/Results: Mr Brain Wo Contrast  04/16/2013   CLINICAL DATA:  Episode of unresponsiveness, possibly syncope or less likely seizure. Underlying progressive dementia. Rule out stroke.  EXAM: MRI HEAD WITHOUT CONTRAST  MRA HEAD WITHOUT CONTRAST  TECHNIQUE: Multiplanar, multiecho pulse sequences of the brain and surrounding structures were obtained without intravenous contrast. Angiographic images of the head were obtained using MRA technique without contrast.  COMPARISON:  Head CT 04/15/2013 and brain MRI 10/28/2011  FINDINGS: MRI HEAD FINDINGS  Images are mildly degraded by motion artifact. Susceptibility artifact is seen in the anterior left frontal lobe at the site of prior hemorrhage. Small amount of susceptibility artifact along the left aspect of the falx corresponds to small subdural hematoma described on recent CT. There punctate foci of susceptibility artifact peripherally within the cerebral hemispheres, most prominently in the right temporoparietal, bilateral frontal, and occipital regions, compatible with remote microhemorrhages. Foci of T2 hyperintensity within the periventricular white matter have mildly increased from the prior study and are nonspecific but compatible with moderate chronic small vessel ischemic disease.  Incidental note is made of a cavum septum pellucidum and vergae. There is no evidence of acute infarct. There is moderate cerebral atrophy. There is no mass or  midline shift. Major intracranial vascular flow voids are unremarkable. Prior bilateral cataract surgeries noted. The visualized paranasal sinuses and mastoid air cells are clear.  MRA HEAD FINDINGS  Images are mildly degraded by motion artifact. The visualized distal vertebral arteries are patent. PICA origins are patent. There is mild irregularity of the distal left vertebral artery without focal stenosis. SCA origins are patent. The left P1 segment is mildly hypoplastic with a prominent left posterior communicating artery present. PCAs are otherwise unremarkable.  Internal carotid arteries are patent from skull base to carotid termini. ACA and MCA origins and visualized branches are patent without evidence of significant stenosis. No intracranial aneurysm is identified.  IMPRESSION: 1. No evidence of acute infarct. 2. Trace falcine subdural hematoma. 3. Small foci of remote hemorrhage scattered throughout both cerebral hemispheres, nonspecific but could be compatible with amyloid angiopathy. 4. Moderate chronic small vessel ischemic disease, mildly progressed from prior MRI. 5. No evidence of major intracranial arterial occlusion or high-grade stenosis. Mild irregularity of the distal left vertebral artery.   Electronically Signed   By: Sebastian Ache   On: 04/16/2013 10:10   Mr Mra Head/brain Wo Cm  04/16/2013   CLINICAL DATA:  Episode of unresponsiveness, possibly syncope or less likely seizure. Underlying progressive dementia. Rule out stroke.  EXAM: MRI HEAD WITHOUT CONTRAST  MRA HEAD WITHOUT CONTRAST  TECHNIQUE: Multiplanar, multiecho pulse sequences of the brain and surrounding structures were obtained without intravenous contrast. Angiographic images of the head were obtained using MRA technique without contrast.  COMPARISON:  Head CT 04/15/2013 and brain MRI 10/28/2011  FINDINGS: MRI HEAD FINDINGS  Images are mildly degraded by motion artifact. Susceptibility artifact is seen in the anterior left frontal  lobe at the site of prior hemorrhage. Small amount of susceptibility artifact along the left aspect of the falx corresponds to small subdural hematoma described on recent CT. There punctate foci of susceptibility artifact peripherally within the cerebral hemispheres, most prominently in the right temporoparietal, bilateral frontal, and occipital regions, compatible with remote microhemorrhages. Foci of T2 hyperintensity within the periventricular white matter have mildly  increased from the prior study and are nonspecific but compatible with moderate chronic small vessel ischemic disease.  Incidental note is made of a cavum septum pellucidum and vergae. There is no evidence of acute infarct. There is moderate cerebral atrophy. There is no mass or midline shift. Major intracranial vascular flow voids are unremarkable. Prior bilateral cataract surgeries noted. The visualized paranasal sinuses and mastoid air cells are clear.  MRA HEAD FINDINGS  Images are mildly degraded by motion artifact. The visualized distal vertebral arteries are patent. PICA origins are patent. There is mild irregularity of the distal left vertebral artery without focal stenosis. SCA origins are patent. The left P1 segment is mildly hypoplastic with a prominent left posterior communicating artery present. PCAs are otherwise unremarkable.  Internal carotid arteries are patent from skull base to carotid termini. ACA and MCA origins and visualized branches are patent without evidence of significant stenosis. No intracranial aneurysm is identified.  IMPRESSION: 1. No evidence of acute infarct. 2. Trace falcine subdural hematoma. 3. Small foci of remote hemorrhage scattered throughout both cerebral hemispheres, nonspecific but could be compatible with amyloid angiopathy. 4. Moderate chronic small vessel ischemic disease, mildly progressed from prior MRI. 5. No evidence of major intracranial arterial occlusion or high-grade stenosis. Mild irregularity  of the distal left vertebral artery.   Electronically Signed   By: Sebastian AcheAllen  Grady   On: 04/16/2013 10:10    MEDICATIONS                                                                                                                       I have reviewed the patient's current medications.  ASSESSMENT/PLAN:                                                                                                           80 year old lady with hypertension, hyperlipidemia and congestive heart failure as well as progressive dementia presenting with unresponsiveness for about 2 hours. Non focal neuro-exam. Trace falcine SDH is obviously unrelated to patient's presentation and no further intervention is required. She has advanced dementia that could predispose her to partial seizures and therefore can not exclude new onset seizure with prolonged postictal state (now back to baseline). Posterior circulation TIA also in the differential but less likely. Awaiting EEG. Will follow up.  Wyatt Portelasvaldo Camilo, MD [facility] [phone]  04/16/2013, 11:36 AM

## 2013-04-16 NOTE — Progress Notes (Signed)
VASCULAR LAB PRELIMINARY  PRELIMINARY  PRELIMINARY  PRELIMINARY  Carotid duplex completed.    Preliminary report:  Bilateral:  1-39% ICA stenosis.  Vertebral artery flow is antegrade.     Mihailo Sage, RVS 04/16/2013, 3:56 PM

## 2013-04-16 NOTE — Progress Notes (Signed)
EEG Completed; Results Pending  

## 2013-04-16 NOTE — Progress Notes (Signed)
  Echocardiogram 2D Echocardiogram has been performed.  Morgan Jenkins, Morgan Jenkins 04/16/2013, 4:52 PM

## 2013-04-16 NOTE — Evaluation (Signed)
Speech Language Pathology Evaluation Patient Details Name: Adan SisLeona P Moshier MRN: [id number] DOB: 1929-10-27 Today's Date: 04/16/2013 Time:  -     Problem List:  Patient Active Problem List   Diagnosis Date Noted  . Encephalopathy 04/15/2013  . Progressive dementia with uncertain etiology 04/15/2013  . Unspecified essential hypertension 04/15/2013  . Syncope 04/15/2013   Past Medical History:  Past Medical History  Diagnosis Date  . Hypertension   . CHF (congestive heart failure)   . Dementia    Past Surgical History: History reviewed. No pertinent past surgical history. HPI:  80 y.o. year old female with significant past medical history of HTN, Dementia, CVA presenting with progressing AMS x 2-3 weeks. Daughter states that pt has been progressively confused since christmas with pt not remembering loved ones and orientation. Has also had recurring falls at home. Does have 24 hour care at home. Today, daughter received call from cargiver about pt being minimally responsive at home.  [facility]. On presentation, pt was noted to be dysarthric and confused. CBC, CMET, UA and CXR WNL. Had head CT that was negative for any acute intracranial abnormality. Did show tiny subdural hematoma in flax region.  MRI revealed No evidence of acute infarct, trace falcine subdural hematoma.   Assessment / Plan / Recommendation Clinical Impression  Pt. exhibited moderate cognitive impairments in the areas of awareness, problem solving, orientation, memory and reasoning.  She appears hard of hearing as well.  Pt. would benefit from ST to facilitate cognitive abilities.    SLP Assessment  Patient needs continued Speech Lanaguage Pathology Services    Follow Up Recommendations   (TBD, CIR?)    Frequency and Duration min 2x/week  2 weeks   Pertinent Vitals/Pain WDL   SLP Goals  SLP Goals Potential to Achieve Goals: Good Potential Considerations:  Co-morbidities;Previous level of function  SLP Evaluation Prior Functioning  Cognitive/Linguistic Baseline: Information not available Type of Home: House Available Help at Discharge: Family;Available 24 hours/day;Personal care attendant   Cognition  Overall Cognitive Status: History of cognitive impairments - at baseline Arousal/Alertness: Awake/alert Orientation Level: Oriented to person;Disoriented to place;Disoriented to time;Disoriented to situation Attention: Sustained Sustained Attention: Appears intact Memory: Impaired Awareness: Impaired Awareness Impairment: Anticipatory impairment;Emergent impairment;Intellectual impairment Problem Solving: Impaired Problem Solving Impairment: Verbal basic;Functional basic Safety/Judgment: Impaired    Comprehension  Auditory Comprehension Overall Auditory Comprehension: Appears within functional limits for tasks assessed Visual Recognition/Discrimination Discrimination: Not tested Reading Comprehension Reading Status: Not tested    Expression Verbal Expression Overall Verbal Expression: Appears within functional limits for tasks assessed Pragmatics: No impairment Written Expression Written Expression: Not tested   Oral / Motor Oral Motor/Sensory Function Overall Oral Motor/Sensory Function: Appears within functional limits for tasks assessed Motor Speech Overall Motor Speech: Appears within functional limits for tasks assessed   GO     Royce MacadamiaLisa Willis Norita Meigs M.Ed [employer]P Pager [phone]  04/16/2013

## 2013-04-16 NOTE — Evaluation (Signed)
Physical Therapy Evaluation Patient Details Name: Morgan Jenkins MRN: [id number] DOB: 1929/06/11 Today's Date: 04/16/2013 Time: 4098-1191 PT Time Calculation (min): 18 min  PT Assessment / Plan / Recommendation History of Present Illness  Morgan Jenkins is an 80 y.o. female history of hypertension, hyperlipidemia, congestive heart failure and progressive dementia who was transferred from [facility] emergency room for further management. Patient had an episode of loss of consciousness which lasted for about 2 hours. No seizure activity reported. Patient was unresponsive to external stimuli including noxious stimuli. Speech was noted to be slightly slurred prior to losing consciousness and on waking up. She is back to baseline state at this point according to her daughter. No focal weakness has been noted. Patient has had frequent recurrent falls. She's had a marked decline in cognitive functioning and increase in confusion over the past 5 weeks. Last fall was 3 days ago. CT scan of her head showed a small 3 mm linear subdural hematoma along the falx.  Clinical Impression  Pt adm due to the above. Pt presents with limitations in mobility and is a fall risk secondary to deficits indicated below. Pt has 24/7 (A) at home with family and personal aide. Pt to benefit from skilled acute PT to increase independence with mobility and return to PLOF to return home. Will recommend HHPT upon acute D/C to help with transition home; family agreeable.   PT Assessment  Patient needs continued PT services    Follow Up Recommendations  Home health PT;Supervision/Assistance - 24 hour    Does the patient have the potential to tolerate intense rehabilitation      Barriers to Discharge   pt has 24/7 (A)    Equipment Recommendations  None recommended by PT    Recommendations for Other Services OT consult   Frequency Min 3X/week    Precautions / Restrictions Precautions Precautions:  Fall Precaution Comments: daughter reports pt falls "all the time"; primarily at night when wandering  Required Braces or Orthoses: Other Brace/Splint Other Brace/Splint: Lt AFO  Restrictions Weight Bearing Restrictions: No   Pertinent Vitals/Pain Stable t/o session.       Mobility  Bed Mobility Overal bed mobility: Needs Assistance Bed Mobility: Supine to Sit Supine to sit: Mod assist;HOB elevated General bed mobility comments: pt with difficulty achieving sitting position due to generalized weakness and difficulty sequencing; requires use of draw pad to bring hips to sitting position while (A) shoulders to EOB Transfers Overall transfer level: Needs assistance Equipment used: 1 person hand held assist Transfers: Sit to/from Stand Sit to Stand: Mod assist General transfer comment: pt unsteady with transfers and tends to keep trunk forwardly flexed; cues for hand placement and sequencing; Rt UE supported throughout transfers; demo decr safety awareness with transfers  Ambulation/Gait Ambulation/Gait assistance: Min assist Ambulation Distance (Feet): 14 Feet Assistive device: 1 person hand held assist Gait Pattern/deviations: Decreased stance time - left;Decreased step length - right;Shuffle;Decreased weight shift to left;Wide base of support;Trunk flexed Gait velocity: decreased Gait velocity interpretation: <1.8 ft/sec, indicative of risk for recurrent falls General Gait Details: pt with decreased stride length and continuous forward flexed posture; cues for gt sequencing and safety; pt reaching for bil UE support; would benefit from ambulating with RW; began to have loose BM during ambulation which limited distance today         PT Diagnosis: Abnormality of gait;Generalized weakness  PT Problem List: Decreased strength;Decreased activity tolerance;Decreased balance;Decreased mobility;Decreased cognition;Decreased knowledge of use of DME;Decreased safety  awareness;Decreased  knowledge of precautions PT Treatment Interventions: DME instruction;Gait training;Functional mobility training;Therapeutic activities;Therapeutic exercise;Balance training;Neuromuscular re-education;Patient/family education     PT Goals(Current goals can be found in the care plan section) Acute Rehab PT Goals Patient Stated Goal: Daughter reports the goal is to keep her mother moving as long as possible PT Goal Formulation: With patient/family Time For Goal Achievement: 04/30/13 Potential to Achieve Goals: Fair  Visit Information  Last PT Received On: 04/16/13 Assistance Needed: +1 History of Present Illness: Morgan Jenkins is an 80 y.o. female history of hypertension, hyperlipidemia, congestive heart failure and progressive dementia who was transferred from [facility] emergency [facility] for further management. Patient had an episode of loss of consciousness which lasted for about 2 hours. No seizure activity reported. Patient was unresponsive to external stimuli including noxious stimuli. Speech was noted to be slightly slurred prior to losing consciousness and on waking up. She is back to baseline state at this point according to her daughter. No focal weakness has been noted. Patient has had frequent recurrent falls. She's had a marked decline in cognitive functioning and increase in confusion over the past 5 weeks. Last fall was 3 days ago. CT scan of her head showed a small 3 mm linear subdural hematoma along the falx.       Prior Functioning  Home Living Family/patient expects to be discharged to:: Private residence Living Arrangements: Children;Other (Comment) Available Help at Discharge: Family;Available 24 hours/day;Personal care attendant Type of Home: House Home Access: Level entry Home Layout: One level Home Equipment: Walker - 2 wheels;Bedside commode;Shower seat;Wheelchair - manual Prior Function Level of Independence: Needs assistance Gait / Transfers  Assistance Needed: pt ambulates with RW; requires (A) to get in/out of bed  ADL's / Homemaking Assistance Needed: total (A) per daughter Communication Communication: No difficulties    Cognition  Cognition Arousal/Alertness: Awake/alert Behavior During Therapy: WFL for tasks assessed/performed Overall Cognitive Status: History of cognitive impairments - at baseline    Extremity/Trunk Assessment Upper Extremity Assessment Upper Extremity Assessment: Defer to OT evaluation Lower Extremity Assessment Lower Extremity Assessment: Generalized weakness Cervical / Trunk Assessment Cervical / Trunk Assessment: Normal   Balance Balance Overall balance assessment: History of Falls;Needs assistance Sitting-balance support: Feet supported;Single extremity supported Sitting balance-Leahy Scale: Fair Postural control: Other (comment) (anterior lean) Standing balance support: During functional activity;Single extremity supported Standing balance-Leahy Scale: Poor  End of Session PT - End of Session Equipment Utilized During Treatment: Gait belt Activity Tolerance: Patient tolerated treatment well Patient left: in chair;with call bell/phone within reach;with chair alarm set;with family/visitor present Nurse Communication: Mobility status;Precautions  GP     Donell SievertWest, Lucious Zou N , [place] CarolinaPT [phone]  04/16/2013, 12:53 PM

## 2013-04-16 NOTE — Progress Notes (Signed)
UR complete.  Derk Doubek RN, MSN 

## 2013-04-16 NOTE — Progress Notes (Signed)
[facility] PROGRESS NOTE  Morgan Jenkins NFA:[id number] Sis8RN:[id number] DOB: February 22, 1930 DOA: 04/15/2013 PCP: No primary provider on file.  Assessment/Plan: #1 syncope Concern for possible seizures in the setting of dementia. MRI of the head was negative for acute infarct however did show a trace falcine subdural hematoma. No further episodes. EEG pending. Neurology following and appreciate input and recommendations.  #2 acute encephalopathy Likely secondary to worsening dementia versus probable seizures with underlying dementia. MRI of the head is negative for any acute infarction. EEG is pending. Patient closed to baseline. Follow.  #3 trace falcine subdural hematoma Patient has been seen by neurology no further workup needed at this time.  #4 hypertension Stable. Continue current regimen of lisinopril, Lopressor.   #5 advanced dementia Stable.  #5 prophylaxis SCDs for DVT prophylaxis.  Code Status: DO NOT RESUSCITATE Family Communication: Updated patient no family at bedside. Disposition Plan: Home when medically stable.   Consultants:  Neurology: Dr. Roseanne RenoStewart 04/15/2013  Procedures:  MRI/MRA of the head 04/16/2013  EEG pending  Antibiotics:  None  HPI/Subjective: Patient alert to self and place. Thinks this is 2025. No complaints.  Objective: Filed Vitals:   04/16/13 1738  BP: 155/75  Pulse: 91  Temp: 98.2 F (36.8 C)  Resp: 20    Intake/Output Summary (Last 24 hours) at 04/16/13 1752 Last data filed at 04/16/13 [id number]  Gross per 24 hour  Intake    120 ml  Output      0 ml  Net    120 ml   Filed Weights   04/15/13 2044  Weight: 74.299 kg (163 lb 12.8 oz)    Exam:   General:  NAD  Cardiovascular: RRR  Respiratory: CTA B.  Abdomen: Soft, nontender, nondistended, positive bowel sounds  Musculoskeletal: No clubbing cyanosis or edema. Left foot brace on.  Data Reviewed: Basic Metabolic Panel:  Recent Labs Lab 04/15/13 2225  CREATININE 0.62    Liver Function Tests: No results found for this basename: AST, ALT, ALKPHOS, BILITOT, PROT, ALBUMIN,  in the last 168 hours No results found for this basename: LIPASE, AMYLASE,  in the last 168 hours No results found for this basename: AMMONIA,  in the last 168 hours CBC:  Recent Labs Lab 04/15/13 2225  WBC 6.7  HGB 10.9*  HCT 34.7*  MCV 83.4  PLT 148*   Cardiac Enzymes: No results found for this basename: CKTOTAL, CKMB, CKMBINDEX, TROPONINI,  in the last 168 hours BNP (last 3 results) No results found for this basename: PROBNP,  in the last 8760 hours CBG: No results found for this basename: GLUCAP,  in the last 168 hours  No results found for this or any previous visit (from the past 240 hour(s)).   Studies: Mr Brain Wo Contrast  04/16/2013   CLINICAL DATA:  Episode of unresponsiveness, possibly syncope or less likely seizure. Underlying progressive dementia. Rule out stroke.  EXAM: MRI HEAD WITHOUT CONTRAST  MRA HEAD WITHOUT CONTRAST  TECHNIQUE: Multiplanar, multiecho pulse sequences of the brain and surrounding structures were obtained without intravenous contrast. Angiographic images of the head were obtained using MRA technique without contrast.  COMPARISON:  Head CT 04/15/2013 and brain MRI 10/28/2011  FINDINGS: MRI HEAD FINDINGS  Images are mildly degraded by motion artifact. Susceptibility artifact is seen in the anterior left frontal lobe at the site of prior hemorrhage. Small amount of susceptibility artifact along the left aspect of the falx corresponds to small subdural hematoma described on recent CT. There punctate foci of susceptibility  artifact peripherally within the cerebral hemispheres, most prominently in the right temporoparietal, bilateral frontal, and occipital regions, compatible with remote microhemorrhages. Foci of T2 hyperintensity within the periventricular white matter have mildly increased from the prior study and are nonspecific but compatible with  moderate chronic small vessel ischemic disease.  Incidental note is made of a cavum septum pellucidum and vergae. There is no evidence of acute infarct. There is moderate cerebral atrophy. There is no mass or midline shift. Major intracranial vascular flow voids are unremarkable. Prior bilateral cataract surgeries noted. The visualized paranasal sinuses and mastoid air cells are clear.  MRA HEAD FINDINGS  Images are mildly degraded by motion artifact. The visualized distal vertebral arteries are patent. PICA origins are patent. There is mild irregularity of the distal left vertebral artery without focal stenosis. SCA origins are patent. The left P1 segment is mildly hypoplastic with a prominent left posterior communicating artery present. PCAs are otherwise unremarkable.  Internal carotid arteries are patent from skull base to carotid termini. ACA and MCA origins and visualized branches are patent without evidence of significant stenosis. No intracranial aneurysm is identified.  IMPRESSION: 1. No evidence of acute infarct. 2. Trace falcine subdural hematoma. 3. Small foci of remote hemorrhage scattered throughout both cerebral hemispheres, nonspecific but could be compatible with amyloid angiopathy. 4. Moderate chronic small vessel ischemic disease, mildly progressed from prior MRI. 5. No evidence of major intracranial arterial occlusion or high-grade stenosis. Mild irregularity of the distal left vertebral artery.   Electronically Signed   By: Sebastian Ache   On: 04/16/2013 10:10   Mr Mra Head/brain Wo Cm  04/16/2013   CLINICAL DATA:  Episode of unresponsiveness, possibly syncope or less likely seizure. Underlying progressive dementia. Rule out stroke.  EXAM: MRI HEAD WITHOUT CONTRAST  MRA HEAD WITHOUT CONTRAST  TECHNIQUE: Multiplanar, multiecho pulse sequences of the brain and surrounding structures were obtained without intravenous contrast. Angiographic images of the head were obtained using MRA technique  without contrast.  COMPARISON:  Head CT 04/15/2013 and brain MRI 10/28/2011  FINDINGS: MRI HEAD FINDINGS  Images are mildly degraded by motion artifact. Susceptibility artifact is seen in the anterior left frontal lobe at the site of prior hemorrhage. Small amount of susceptibility artifact along the left aspect of the falx corresponds to small subdural hematoma described on recent CT. There punctate foci of susceptibility artifact peripherally within the cerebral hemispheres, most prominently in the right temporoparietal, bilateral frontal, and occipital regions, compatible with remote microhemorrhages. Foci of T2 hyperintensity within the periventricular white matter have mildly increased from the prior study and are nonspecific but compatible with moderate chronic small vessel ischemic disease.  Incidental note is made of a cavum septum pellucidum and vergae. There is no evidence of acute infarct. There is moderate cerebral atrophy. There is no mass or midline shift. Major intracranial vascular flow voids are unremarkable. Prior bilateral cataract surgeries noted. The visualized paranasal sinuses and mastoid air cells are clear.  MRA HEAD FINDINGS  Images are mildly degraded by motion artifact. The visualized distal vertebral arteries are patent. PICA origins are patent. There is mild irregularity of the distal left vertebral artery without focal stenosis. SCA origins are patent. The left P1 segment is mildly hypoplastic with a prominent left posterior communicating artery present. PCAs are otherwise unremarkable.  Internal carotid arteries are patent from skull base to carotid termini. ACA and MCA origins and visualized branches are patent without evidence of significant stenosis. No intracranial aneurysm is identified.  IMPRESSION:  1. No evidence of acute infarct. 2. Trace falcine subdural hematoma. 3. Small foci of remote hemorrhage scattered throughout both cerebral hemispheres, nonspecific but could be  compatible with amyloid angiopathy. 4. Moderate chronic small vessel ischemic disease, mildly progressed from prior MRI. 5. No evidence of major intracranial arterial occlusion or high-grade stenosis. Mild irregularity of the distal left vertebral artery.   Electronically Signed   By: Sebastian Ache   On: 04/16/2013 10:10    Scheduled Meds: . atorvastatin  80 mg Oral Daily  . calcium carbonate  625 mg Oral BID WC  . cholecalciferol  1,000 Units Oral Daily  . clopidogrel  75 mg Oral Q breakfast  . DULoxetine  30 mg Oral Daily  . enoxaparin (LOVENOX) injection  40 mg Subcutaneous QHS  . lisinopril  20 mg Oral Daily  . metoprolol succinate  25 mg Oral Daily  . multivitamin with minerals  1 tablet Oral Daily  . pantoprazole  40 mg Oral Daily  . traMADol  50 mg Oral BID   Continuous Infusions:   Principal Problem:   Syncope Active Problems:   Encephalopathy   Progressive dementia with uncertain etiology   Unspecified essential hypertension   Subdural hematoma    Time spent: 35 minutes    THOMPSON,DANIEL M.D. [facility] Pager [phone]. If 7PM-7AM, please contact night-coverage at [email or web address], password [facility] 04/16/2013, 5:52 PM  LOS: 1 day

## 2013-04-17 LAB — BASIC METABOLIC PANEL
BUN: 14 mg/dL (ref 6–23)
CALCIUM: 9.6 mg/dL (ref 8.4–10.5)
CO2: 25 mEq/L (ref 19–32)
Chloride: 102 mEq/L (ref 96–112)
Creatinine, Ser: 0.62 mg/dL (ref 0.50–1.10)
GFR calc non Af Amer: 82 mL/min — ABNORMAL LOW (ref >90)
GLUCOSE: 123 mg/dL — AB (ref 70–99)
POTASSIUM: 3.8 meq/L (ref 3.7–5.3)
Sodium: 141 mEq/L (ref 137–147)

## 2013-04-17 LAB — LIPID PANEL
CHOL/HDL RATIO: 2.8 ratio
CHOLESTEROL: 136 mg/dL (ref 0–200)
HDL: 48 mg/dL (ref >39)
LDL Cholesterol: 66 mg/dL (ref 0–99)
Triglycerides: 112 mg/dL (ref <150)
VLDL: 22 mg/dL (ref 0–40)

## 2013-04-17 MED ORDER — CALCIUM CARBONATE 1250 (500 CA) MG PO TABS
500.0000 mg | ORAL_TABLET | Freq: Two times a day (BID) | ORAL | Status: DC
  Administered 2013-04-17 – 2013-04-20 (×5): 500 mg via ORAL
  Filled 2013-04-17 (×8): qty 1

## 2013-04-17 MED ORDER — DIVALPROEX SODIUM ER 500 MG PO TB24
500.0000 mg | ORAL_TABLET | Freq: Every day | ORAL | Status: DC
  Administered 2013-04-17 – 2013-04-19 (×3): 500 mg via ORAL
  Filled 2013-04-17 (×4): qty 1

## 2013-04-17 NOTE — Progress Notes (Signed)
Physical Therapy Treatment Patient Details Name: Morgan Jenkins Inlow MRN: [id number] DOB: 1929/09/30 Today's Date: 04/17/2013 Time: [id number] PT Time Calculation (min): 26 min  PT Assessment / Plan / Recommendation  History of Present Illness Morgan Jenkins Macari is an 80 y.o. female history of hypertension, hyperlipidemia, congestive heart failure and progressive dementia who was transferred from [facility] emergency room for further management. Patient had an episode of loss of consciousness which lasted for about 2 hours. No seizure activity reported. Patient was unresponsive to external stimuli including noxious stimuli. Speech was noted to be slightly slurred prior to losing consciousness and on waking up. She is back to baseline state at this point according to her daughter. No focal weakness has been noted. Patient has had frequent recurrent falls. She's had a marked decline in cognitive functioning and increase in confusion over the past 5 weeks. Last fall was 3 days ago. CT scan of her head showed a small 3 mm linear subdural hematoma along the falx.   PT Comments   Patient with increased confusion during this session. Max encourage for patient to get to recliner. Could not attempt ambulation. Daughter present and stated they are fully equiped to care for patient at this level at home. Continue to recommend HHPT  Follow Up Recommendations  Home health PT;Supervision/Assistance - 24 hour     Does the patient have the potential to tolerate intense rehabilitation     Barriers to Discharge        Equipment Recommendations  None recommended by PT    Recommendations for Other Services    Frequency Min 3X/week   Progress towards PT Goals Progress towards PT goals: Progressing toward goals  Plan Current plan remains appropriate    Precautions / Restrictions Precautions Precautions: Fall Precaution Comments: daughter reports pt falls "all the time"; primarily at night  when wandering  Required Braces or Orthoses: Other Brace/Splint Other Brace/Splint: Lt AFO    Pertinent Vitals/Pain Denied pain    Mobility  Bed Mobility Supine to sit: Mod assist;HOB elevated General bed mobility comments: Patient attempting several times to lay back to supine once sitting. Multiple cues and instruction on task. A for initiating legs out of bed and for trunk control (Simultaneous filing. User may not have seen previous data.) Transfers Overall transfer level: Needs assistance (Simultaneous filing. User may not have seen previous data.) Equipment used: 1 person hand held assist (Gait belt) Transfers: Stand Pivot Transfers (Simultaneous filing. User may not have seen previous data.) Sit to Stand: Mod assist (Simultaneous filing. User may not have seen previous data.) Stand pivot transfers: Mod assist Squat pivot transfers: Mod assist General transfer comment: pt unsteady with transfers and tends to keep trunk forwardly flexed; cues for hand placement and sequencing; Patient unable to walk this session due to anxiety and fear of falling (Simultaneous filing. User may not have seen previous data.)    Exercises     PT Diagnosis:    PT Problem List:   PT Treatment Interventions:     PT Goals (current goals can now be found in the care plan section) Acute Rehab PT Goals Patient Stated Goal: Daughter reports the goal is to keep her mother moving as long as possible  Visit Information  Assistance Needed: +1 History of Present Illness: Morgan Jenkins Cara is an 80 y.o. female history of hypertension, hyperlipidemia, congestive heart failure and progressive dementia who was transferred from [facility] emergency room for further management. Patient had an  episode of loss of consciousness which lasted for about 2 hours. No seizure activity reported. Patient was unresponsive to external stimuli including noxious stimuli. Speech was noted to be slightly slurred  prior to losing consciousness and on waking up. She is back to baseline state at this point according to her daughter. No focal weakness has been noted. Patient has had frequent recurrent falls. She's had a marked decline in cognitive functioning and increase in confusion over the past 5 weeks. Last fall was 3 days ago. CT scan of her head showed a small 3 mm linear subdural hematoma along the falx.    Subjective Data  Patient Stated Goal: Daughter reports the goal is to keep her mother moving as long as possible   Cognition  Cognition Arousal/Alertness: Awake/alert Behavior During Therapy: Restless Overall Cognitive Status: History of cognitive impairments - at baseline    Balance  Balance Overall balance assessment: History of Falls;Needs assistance Sitting-balance support: Feet supported;Single extremity supported Sitting balance-Leahy Scale: Fair Postural control: Posterior lean (Posterior lean in static standing, anterior lean during transfers) Standing balance support: Bilateral upper extremity supported;During functional activity Standing balance-Leahy Scale: Poor  End of Session PT - End of Session Equipment Utilized During Treatment: Gait belt Activity Tolerance: Patient limited by fatigue Patient left: in chair;with call bell/phone within reach;with chair alarm set;with family/visitor present Nurse Communication: Mobility status;Precautions   GP     Fredrich Birks 04/17/2013, 12:45 PM 04/17/2013 Fredrich Birks PTA [phone] pager [phone] office

## 2013-04-17 NOTE — Progress Notes (Signed)
[facility] PROGRESS NOTE  Morgan Jenkins QMV:[id number] DOB: Jul 20, 1929 DOA: 04/15/2013 PCP: No primary provider on file.  Assessment/Plan: #1 syncope Concern for possible seizures in the setting of dementia. MRI of the head was negative for acute infarct however did show a trace falcine subdural hematoma. No further episodes. EEG is within normal limits. Carotid ultrasound with no significant stenosis. 2-D echo with no source of emboli. Neurology following and appreciate input and recommendations.  #2 acute encephalopathy Likely secondary to worsening dementia versus probable seizures with underlying dementia. MRI of the head is negative for any acute infarction. EEG is normal. Patient close to her baseline. Follow.  #3 trace falcine subdural hematoma Patient has been seen by neurology no further workup needed at this time.  #4 hypertension Stable. Continue current regimen of lisinopril, Lopressor.   #5 advanced dementia Stable. Will start patient on Depakote ER 500 mg at bedtime for mood/behavioral issues per neurology recommendations.  #5 prophylaxis SCDs for DVT prophylaxis.  Code Status: DO NOT RESUSCITATE Family Communication: Updated patient and daughter at bedside. Disposition Plan: Home when medically stable.   Consultants:  Neurology: Dr. Roseanne RenoStewart 04/15/2013  Procedures:  MRI/MRA of the head 04/16/2013  EEG pending  Carotid US 04/16/13  Antibiotics:  None  HPI/Subjective: Patient resting comfortably. No complaints.  Objective: Filed Vitals:   04/17/13 0251  BP: 163/83  Pulse:   Temp:   Resp:    No intake or output data in the 24 hours ending 04/17/13 1125 Filed Weights   04/15/13 2044  Weight: 74.299 kg (163 lb 12.8 oz)    Exam:   General:  NAD. Sleeping.  Cardiovascular: RRR  Respiratory: CTA B.  Abdomen: Soft, nontender, nondistended, positive bowel sounds  Musculoskeletal: No clubbing cyanosis or edema. Left foot brace  on.  Data Reviewed: Basic Metabolic Panel:  Recent Labs Lab 04/15/13 2225 04/17/13 0533  NA  --  141  K  --  3.8  CL  --  102  CO2  --  25  GLUCOSE  --  123*  BUN  --  14  CREATININE 0.62 0.62  CALCIUM  --  9.6   Liver Function Tests: No results found for this basename: AST, ALT, ALKPHOS, BILITOT, PROT, ALBUMIN,  in the last 168 hours No results found for this basename: LIPASE, AMYLASE,  in the last 168 hours No results found for this basename: AMMONIA,  in the last 168 hours CBC:  Recent Labs Lab 04/15/13 2225  WBC 6.7  HGB 10.9*  HCT 34.7*  MCV 83.4  PLT 148*   Cardiac Enzymes: No results found for this basename: CKTOTAL, CKMB, CKMBINDEX, TROPONINI,  in the last 168 hours BNP (last 3 results) No results found for this basename: PROBNP,  in the last 8760 hours CBG: No results found for this basename: GLUCAP,  in the last 168 hours  No results found for this or any previous visit (from the past 240 hour(s)).   Studies: Mr Brain Wo Contrast  04/16/2013   CLINICAL DATA:  Episode of unresponsiveness, possibly syncope or less likely seizure. Underlying progressive dementia. Rule out stroke.  EXAM: MRI HEAD WITHOUT CONTRAST  MRA HEAD WITHOUT CONTRAST  TECHNIQUE: Multiplanar, multiecho pulse sequences of the brain and surrounding structures were obtained without intravenous contrast. Angiographic images of the head were obtained using MRA technique without contrast.  COMPARISON:  Head CT 04/15/2013 and brain MRI 10/28/2011  FINDINGS: MRI HEAD FINDINGS  Images are mildly degraded by motion artifact. Susceptibility artifact  is seen in the anterior left frontal lobe at the site of prior hemorrhage. Small amount of susceptibility artifact along the left aspect of the falx corresponds to small subdural hematoma described on recent CT. There punctate foci of susceptibility artifact peripherally within the cerebral hemispheres, most prominently in the right temporoparietal, bilateral  frontal, and occipital regions, compatible with remote microhemorrhages. Foci of T2 hyperintensity within the periventricular white matter have mildly increased from the prior study and are nonspecific but compatible with moderate chronic small vessel ischemic disease.  Incidental note is made of a cavum septum pellucidum and vergae. There is no evidence of acute infarct. There is moderate cerebral atrophy. There is no mass or midline shift. Major intracranial vascular flow voids are unremarkable. Prior bilateral cataract surgeries noted. The visualized paranasal sinuses and mastoid air cells are clear.  MRA HEAD FINDINGS  Images are mildly degraded by motion artifact. The visualized distal vertebral arteries are patent. PICA origins are patent. There is mild irregularity of the distal left vertebral artery without focal stenosis. SCA origins are patent. The left P1 segment is mildly hypoplastic with a prominent left posterior communicating artery present. PCAs are otherwise unremarkable.  Internal carotid arteries are patent from skull base to carotid termini. ACA and MCA origins and visualized branches are patent without evidence of significant stenosis. No intracranial aneurysm is identified.  IMPRESSION: 1. No evidence of acute infarct. 2. Trace falcine subdural hematoma. 3. Small foci of remote hemorrhage scattered throughout both cerebral hemispheres, nonspecific but could be compatible with amyloid angiopathy. 4. Moderate chronic small vessel ischemic disease, mildly progressed from prior MRI. 5. No evidence of major intracranial arterial occlusion or high-grade stenosis. Mild irregularity of the distal left vertebral artery.   Electronically Signed   By: Sebastian Ache   On: 04/16/2013 10:10   Mr Mra Head/brain Wo Cm  04/16/2013   CLINICAL DATA:  Episode of unresponsiveness, possibly syncope or less likely seizure. Underlying progressive dementia. Rule out stroke.  EXAM: MRI HEAD WITHOUT CONTRAST  MRA HEAD  WITHOUT CONTRAST  TECHNIQUE: Multiplanar, multiecho pulse sequences of the brain and surrounding structures were obtained without intravenous contrast. Angiographic images of the head were obtained using MRA technique without contrast.  COMPARISON:  Head CT 04/15/2013 and brain MRI 10/28/2011  FINDINGS: MRI HEAD FINDINGS  Images are mildly degraded by motion artifact. Susceptibility artifact is seen in the anterior left frontal lobe at the site of prior hemorrhage. Small amount of susceptibility artifact along the left aspect of the falx corresponds to small subdural hematoma described on recent CT. There punctate foci of susceptibility artifact peripherally within the cerebral hemispheres, most prominently in the right temporoparietal, bilateral frontal, and occipital regions, compatible with remote microhemorrhages. Foci of T2 hyperintensity within the periventricular white matter have mildly increased from the prior study and are nonspecific but compatible with moderate chronic small vessel ischemic disease.  Incidental note is made of a cavum septum pellucidum and vergae. There is no evidence of acute infarct. There is moderate cerebral atrophy. There is no mass or midline shift. Major intracranial vascular flow voids are unremarkable. Prior bilateral cataract surgeries noted. The visualized paranasal sinuses and mastoid air cells are clear.  MRA HEAD FINDINGS  Images are mildly degraded by motion artifact. The visualized distal vertebral arteries are patent. PICA origins are patent. There is mild irregularity of the distal left vertebral artery without focal stenosis. SCA origins are patent. The left P1 segment is mildly hypoplastic with a prominent left posterior  communicating artery present. PCAs are otherwise unremarkable.  Internal carotid arteries are patent from skull base to carotid termini. ACA and MCA origins and visualized branches are patent without evidence of significant stenosis. No intracranial  aneurysm is identified.  IMPRESSION: 1. No evidence of acute infarct. 2. Trace falcine subdural hematoma. 3. Small foci of remote hemorrhage scattered throughout both cerebral hemispheres, nonspecific but could be compatible with amyloid angiopathy. 4. Moderate chronic small vessel ischemic disease, mildly progressed from prior MRI. 5. No evidence of major intracranial arterial occlusion or high-grade stenosis. Mild irregularity of the distal left vertebral artery.   Electronically Signed   By: Sebastian Ache   On: 04/16/2013 10:10    Scheduled Meds: . atorvastatin  80 mg Oral Daily  . calcium carbonate  500 mg of elemental calcium Oral BID WC  . cholecalciferol  1,000 Units Oral Daily  . clopidogrel  75 mg Oral Q breakfast  . DULoxetine  30 mg Oral Daily  . enoxaparin (LOVENOX) injection  40 mg Subcutaneous QHS  . lisinopril  20 mg Oral Daily  . metoprolol succinate  25 mg Oral Daily  . multivitamin with minerals  1 tablet Oral Daily  . pantoprazole  40 mg Oral Daily  . traMADol  50 mg Oral BID   Continuous Infusions:   Principal Problem:   Syncope Active Problems:   Encephalopathy   Progressive dementia with uncertain etiology   Unspecified essential hypertension   Subdural hematoma    Time spent: 35 minutes    Sequita Wise M.D. [facility] Pager [phone]. If 7PM-7AM, please contact night-coverage at [email or web address], password [facility] 04/17/2013, 11:25 AM  LOS: 2 days

## 2013-04-17 NOTE — Procedures (Signed)
EEG report.  Brief clinical history:  80 year old lady with hypertension, hyperlipidemia and congestive heart failure as well as progressive dementia presenting with unresponsiveness for about 2 hours.  Technique: this is a 17 channel routine scalp EEG performed at the bedside with bipolar and monopolar montages arranged in accordance to the international 10/20 system of electrode placement. One channel was dedicated to EKG recording.  No sleep was achieved during this recording. No activating procedures performed.  Description:In the wakeful state, the best background consisted of a medium amplitude, posterior dominant, well sustained, symmetric and reactive 8-9 Hz rhythm. No epileptiform discharges noted. No slowing seen.  EKG showed sinus rhythm.  Impression: this is a normal awake EEG. Please, be aware that a normal EEG does not exclude the possibility of epilepsy.  Clinical correlation is advised.  Wyatt Portelasvaldo Manuella Blackson, MD

## 2013-04-17 NOTE — Evaluation (Signed)
Occupational Therapy Evaluation Patient Details Name: Morgan Jenkins MRN: [id number] DOB: 11/08/1929 Today's Date: 04/17/2013 Time: 1203-1229 OT Time Calculation (min): 26 min  OT Assessment / Plan / Recommendation History of present illness Morgan Jenkins is an 80 y.o. female history of hypertension, hyperlipidemia, congestive heart failure and progressive dementia who was transferred from [facility] emergency room for further management. Patient had an episode of loss of consciousness which lasted for about 2 hours. No seizure activity reported. Patient was unresponsive to external stimuli including noxious stimuli. Speech was noted to be slightly slurred prior to losing consciousness and on waking up. She is back to baseline state at this point according to her daughter. No focal weakness has been noted. Patient has had frequent recurrent falls. She's had a marked decline in cognitive functioning and increase in confusion over the past 5 weeks. Last fall was 3 days ago. CT scan of her head showed a small 3 mm linear subdural hematoma along the falx.   Clinical Impression   Pt presents w/ deficits in her ability to perform functional transfers related to ADL's (see problem list below). Pt's daughter reports that pt is total assist for ADL's & homemaking prior to this admission. Will follow acutely to assist in maximizing independence w/ ADL/transfers, [facility] and make home/safety recommendations.    OT Assessment  Patient needs continued OT Services    Follow Up Recommendations  Home health OT;Supervision/Assistance - 24 hour    Barriers to Discharge      Equipment Recommendations  None recommended by OT    Recommendations for Other Services    Frequency  Min 2X/week    Precautions / Restrictions Precautions Precautions: Fall Precaution Comments: daughter reports pt falls "all the time"; primarily at night when wandering  Required Braces  or Orthoses: Other Brace/Splint Other Brace/Splint: Lt AFO    Pertinent Vitals/Pain No c/o.    ADL  Eating/Feeding: Performed;Set up;Supervision/safety Where Assessed - Eating/Feeding: Chair Grooming: Performed;Wash/dry hands;Wash/dry face;Set up;Supervision/safety Where Assessed - Grooming: Supported sitting Upper Body Bathing: Simulated;Set up;Minimal assistance Where Assessed - Upper Body Bathing: Supported sitting Lower Body Bathing: Performed;+1 Total assistance Where Assessed - Lower Body Bathing: Supported sit to stand Upper Body Dressing: Simulated;Minimal assistance;Set up Where Assessed - Upper Body Dressing: Supported sitting Lower Body Dressing: Performed;+1 Total assistance Where Assessed - Lower Body Dressing: Supported sit to stand Toilet Transfer: Performed;+1 Total assistance Toilet Transfer Method: Sit to stand;Stand pivot AcupuncturistToilet Transfer Equipment: Bedside commode Toileting - Clothing Manipulation and Hygiene: Performed;+1 Total assistance Where Assessed - Engineer, miningToileting Clothing Manipulation and Hygiene: Sit to stand from 3-in-1 or toilet Tub/Shower Transfer Method: Not assessed Equipment Used: Gait belt;Rolling walker;Other (comment) (3:1) Transfers/Ambulation Related to ADLs: Pt is +1 total assist for sit to stand w/ RW using gait belt. Attempted SPT from chair to 3:1 using gait belt w/o RW and pt was Mod A noted. ADL Comments: Per chart review and discussion w/ pt's daughter, her PLOF is total assist for ADL's and homemaking tasks and Min A w/ transfers using RW. Pt has h/o falls at home and would benefit from [facility] in home recommendations for safety and ADL's/transfers. Pt able to follow 1 step commands, but does have h/o progressive dementia noted. Pt participated in ADL retraining session w/ focus on transfer to 3:1 & pt was noted to urinate in chair and on floor prior to SPT. Pt unaware.     OT Diagnosis:  Generalized weakness;Cognitive  deficits;Acute pain  OT Problem List: Decreased activity tolerance;Impaired balance (sitting and/or standing);Decreased knowledge of precautions;Decreased knowledge of use of DME or AE;Decreased safety awareness;Decreased cognition;Pain;Cardiopulmonary status limiting activity OT Treatment Interventions: Self-care/ADL training;Patient/family education;DME and/or AE instruction;Cognitive remediation/compensation;Therapeutic activities;Balance training   OT Goals(Current goals can be found in the care plan section) Acute Rehab OT Goals Patient Stated Goal: Daughter reports the goal is to keep her mother moving as long as possible Time For Goal Achievement: 05/01/13 Potential to Achieve Goals: Fair  Visit Information  Last OT Received On: 04/17/13 Assistance Needed: +1 History of Present Illness: [name] is an 80 y.o. female history of hypertension, hyperlipidemia, congestive heart failure and progressive dementia who was transferred from [facility] emergency room for further management. Patient had an episode of loss of consciousness which lasted for about 2 hours. No seizure activity reported. Patient was unresponsive to external stimuli including noxious stimuli. Speech was noted to be slightly slurred prior to losing consciousness and on waking up. She is back to baseline state at this point according to her daughter. No focal weakness has been noted. Patient has had frequent recurrent falls. She's had a marked decline in cognitive functioning and increase in confusion over the past 5 weeks. Last fall was 3 days ago. CT scan of her head showed a small 3 mm linear subdural hematoma along the falx.       Prior Functioning     Home Living Family/patient expects to be discharged to:: Private residence Living Arrangements: Children;Other (Comment) Available Help at Discharge: Family;Available 24 hours/day;Personal care attendant Type of Home: House Home Access:  Level entry Home Layout: One level Home Equipment: Walker - 2 wheels;Bedside commode;Shower seat;Wheelchair - manual Prior Function Level of Independence: Needs assistance Gait / Transfers Assistance Needed: pt ambulates with RW; requires (A) to get in/out of bed  ADL's / Homemaking Assistance Needed: total (A) per daughter Communication Communication: No difficulties Dominant Hand: Right    Vision/Perception Vision - History Baseline Vision: Wears glasses all the time Vision - Assessment Vision Assessment: Vision not tested   Cognition  Cognition Arousal/Alertness: Awake/alert Behavior During Therapy: Restless Overall Cognitive Status: History of cognitive impairments - at baseline    Extremity/Trunk Assessment Upper Extremity Assessment Upper Extremity Assessment: Overall WFL for tasks assessed Lower Extremity Assessment Lower Extremity Assessment: Defer to PT evaluation;Generalized weakness Cervical / Trunk Assessment Cervical / Trunk Assessment: Normal    Mobility Bed Mobility Supine to sit: Mod assist;HOB elevated General bed mobility comments: Patient attempting several times to lay back to supine once sitting. Multiple cues and instruction on task. A for initiating legs out of bed and for trunk control (Simultaneous filing. User may not have seen previous data.) Transfers Overall transfer level: Needs assistance (Simultaneous filing. User may not have seen previous data.) Equipment used: 1 person hand held assist (Gait belt) Transfers: Stand Pivot Transfers (Simultaneous filing. User may not have seen previous data.) Sit to Stand: Mod assist (Simultaneous filing. User may not have seen previous data.) Stand pivot transfers: Mod assist Squat pivot transfers: Mod assist General transfer comment: pt unsteady with transfers and tends to keep trunk forwardly flexed; cues for hand placement and sequencing; Patient unable to walk this session due to anxiety and fear of falling  (Simultaneous filing. User may not have seen previous data.)        Balance Balance Overall balance assessment: History of Falls;Needs assistance Sitting-balance support: Feet supported;Single extremity supported Sitting balance-Leahy Scale:  Fair Postural control: Posterior lean (Posterior lean in static standing, anterior lean during transfers) Standing balance support: Bilateral upper extremity supported;During functional activity Standing balance-Leahy Scale: Poor   End of Session OT - End of Session Equipment Utilized During Treatment: Gait belt;Rolling walker;Other (comment) (Pt has AFO Left) Activity Tolerance: Patient tolerated treatment well Patient left: in chair;with call bell/phone within reach;with nursing/sitter in room;with family/visitor present Nurse Communication: Mobility status;Precautions  GO     Alm Bustard 04/17/2013, 12:45 PM

## 2013-04-17 NOTE — Progress Notes (Signed)
NEURO HOSPITALIST PROGRESS NOTE   SUBJECTIVE:                                                                                                                        Morgan Jenkins said that she is doing quite well this morning. Family member at the bedside has no new concerns but wondering about " the blood in the brain and EEG". I reviewed her EEG and the study is normal. CUS and TTE also unimpressive.  OBJECTIVE:                                                                                                                           Vital signs in last 24 hours: Temp:  [96 F (35.6 C)-98.2 F (36.8 C)] 96 F (35.6 C) (01/29 1000) Pulse Rate:  [78-91] 78 (01/29 1000) Resp:  [18-20] 18 (01/29 1000) BP: (141-183)/(48-83) 141/48 mmHg (01/29 1000) SpO2:  [90 %-95 %] 95 % (01/29 1000)  Intake/Output from previous day: 01/28 0701 - 01/29 0700 In: 120 [P.O.:120] Out: -  Intake/Output this shift:   Nutritional status: Cardiac  Past Medical History  Diagnosis Date  . Hypertension   . CHF (congestive heart failure)   . Dementia     Neurologic Exam:  Mental Status:  Alert and awake, oriented to place but disoriented to time. Speech fluent without evidence of aphasia. Able to follow commands without difficulty.  Cranial Nerves:  II-Visual fields were normal.  III/IV/VI-right pupil was slightly larger than left; both pupils reactive to light. Extraocular movements were full and conjugate.  V/VII-no facial numbness and no facial weakness.  VIII-normal.  X-normal speech.  Motor: 5/5 bilaterally with normal tone and bulk, except for marked left foot drop (chronic).  Sensory: Normal throughout.  Deep Tendon Reflexes: 1+ and symmetric.  Plantars: Mute bilaterally  Cerebellar: Normal finger-to-nose testing.      Lab Results: Lab Results  Component Value Date/Time   CHOL 136 04/17/2013  5:33 AM   Lipid Panel  Recent Labs  04/17/13 0533   CHOL 136  TRIG 112  HDL 48  CHOLHDL 2.8  VLDL 22  LDLCALC 66    Studies/Results: Mr Brain Wo Contrast  04/16/2013   CLINICAL DATA:  Episode  of unresponsiveness, possibly syncope or less likely seizure. Underlying progressive dementia. Rule out stroke.  EXAM: MRI HEAD WITHOUT CONTRAST  MRA HEAD WITHOUT CONTRAST  TECHNIQUE: Multiplanar, multiecho pulse sequences of the brain and surrounding structures were obtained without intravenous contrast. Angiographic images of the head were obtained using MRA technique without contrast.  COMPARISON:  Head CT 04/15/2013 and brain MRI 10/28/2011  FINDINGS: MRI HEAD FINDINGS  Images are mildly degraded by motion artifact. Susceptibility artifact is seen in the anterior left frontal lobe at the site of prior hemorrhage. Small amount of susceptibility artifact along the left aspect of the falx corresponds to small subdural hematoma described on recent CT. There punctate foci of susceptibility artifact peripherally within the cerebral hemispheres, most prominently in the right temporoparietal, bilateral frontal, and occipital regions, compatible with remote microhemorrhages. Foci of T2 hyperintensity within the periventricular white matter have mildly increased from the prior study and are nonspecific but compatible with moderate chronic small vessel ischemic disease.  Incidental note is made of a cavum septum pellucidum and vergae. There is no evidence of acute infarct. There is moderate cerebral atrophy. There is no mass or midline shift. Major intracranial vascular flow voids are unremarkable. Prior bilateral cataract surgeries noted. The visualized paranasal sinuses and mastoid air cells are clear.  MRA HEAD FINDINGS  Images are mildly degraded by motion artifact. The visualized distal vertebral arteries are patent. PICA origins are patent. There is mild irregularity of the distal left vertebral artery without focal stenosis. SCA origins are patent. The left P1  segment is mildly hypoplastic with a prominent left posterior communicating artery present. PCAs are otherwise unremarkable.  Internal carotid arteries are patent from skull base to carotid termini. ACA and MCA origins and visualized branches are patent without evidence of significant stenosis. No intracranial aneurysm is identified.  IMPRESSION: 1. No evidence of acute infarct. 2. Trace falcine subdural hematoma. 3. Small foci of remote hemorrhage scattered throughout both cerebral hemispheres, nonspecific but could be compatible with amyloid angiopathy. 4. Moderate chronic small vessel ischemic disease, mildly progressed from prior MRI. 5. No evidence of major intracranial arterial occlusion or high-grade stenosis. Mild irregularity of the distal left vertebral artery.   Electronically Signed   By: Sebastian Ache   On: 04/16/2013 10:10   Mr Mra Head/brain Wo Cm  04/16/2013   CLINICAL DATA:  Episode of unresponsiveness, possibly syncope or less likely seizure. Underlying progressive dementia. Rule out stroke.  EXAM: MRI HEAD WITHOUT CONTRAST  MRA HEAD WITHOUT CONTRAST  TECHNIQUE: Multiplanar, multiecho pulse sequences of the brain and surrounding structures were obtained without intravenous contrast. Angiographic images of the head were obtained using MRA technique without contrast.  COMPARISON:  Head CT 04/15/2013 and brain MRI 10/28/2011  FINDINGS: MRI HEAD FINDINGS  Images are mildly degraded by motion artifact. Susceptibility artifact is seen in the anterior left frontal lobe at the site of prior hemorrhage. Small amount of susceptibility artifact along the left aspect of the falx corresponds to small subdural hematoma described on recent CT. There punctate foci of susceptibility artifact peripherally within the cerebral hemispheres, most prominently in the right temporoparietal, bilateral frontal, and occipital regions, compatible with remote microhemorrhages. Foci of T2 hyperintensity within the  periventricular white matter have mildly increased from the prior study and are nonspecific but compatible with moderate chronic small vessel ischemic disease.  Incidental note is made of a cavum septum pellucidum and vergae. There is no evidence of acute infarct. There is moderate cerebral atrophy. There is  no mass or midline shift. Major intracranial vascular flow voids are unremarkable. Prior bilateral cataract surgeries noted. The visualized paranasal sinuses and mastoid air cells are clear.  MRA HEAD FINDINGS  Images are mildly degraded by motion artifact. The visualized distal vertebral arteries are patent. PICA origins are patent. There is mild irregularity of the distal left vertebral artery without focal stenosis. SCA origins are patent. The left P1 segment is mildly hypoplastic with a prominent left posterior communicating artery present. PCAs are otherwise unremarkable.  Internal carotid arteries are patent from skull base to carotid termini. ACA and MCA origins and visualized branches are patent without evidence of significant stenosis. No intracranial aneurysm is identified.  IMPRESSION: 1. No evidence of acute infarct. 2. Trace falcine subdural hematoma. 3. Small foci of remote hemorrhage scattered throughout both cerebral hemispheres, nonspecific but could be compatible with amyloid angiopathy. 4. Moderate chronic small vessel ischemic disease, mildly progressed from prior MRI. 5. No evidence of major intracranial arterial occlusion or high-grade stenosis. Mild irregularity of the distal left vertebral artery.   Electronically Signed   By: Sebastian AcheAllen  Grady   On: 04/16/2013 10:10    MEDICATIONS                                                                                                                       I have reviewed the patient's current medications.  ASSESSMENT/PLAN:                                                                                                            80 year old lady  with hypertension, hyperlipidemia and congestive heart failure as well as progressive dementia presenting with unresponsiveness for about 2 hours. Non focal neuro-exam. Trace falcine SDH is obviously unrelated to patient's presentation and no further intervention is required. EEG unremarkable. Did she have a seizure with prolonged post ictal state? She has advanced dementia and this is certainly possible even with a normal EEG.  I spoke with patient's daughter on the phone and she will be in favor of trying a low dose of an anticonvulsant. I think that discharging her on low dose Depakote ER 500 mg at night could be a reasonable choice ( LFT's are normal) as she also has some mood/behavioral issues resulting from her dementia. No further neurological intervention needed at this moment. Will sign off.   Wyatt Portelasvaldo Camilo, MD [facility] [phone]  04/17/2013, 11:52 AM

## 2013-04-18 LAB — BASIC METABOLIC PANEL
BUN: 17 mg/dL (ref 6–23)
CHLORIDE: 101 meq/L (ref 96–112)
CO2: 26 mEq/L (ref 19–32)
Calcium: 9.7 mg/dL (ref 8.4–10.5)
Creatinine, Ser: 0.64 mg/dL (ref 0.50–1.10)
GFR, EST NON AFRICAN AMERICAN: 81 mL/min — AB (ref >90)
Glucose, Bld: 122 mg/dL — ABNORMAL HIGH (ref 70–99)
Potassium: 3.9 mEq/L (ref 3.7–5.3)
SODIUM: 143 meq/L (ref 137–147)

## 2013-04-18 NOTE — Progress Notes (Signed)
Physical Therapy Treatment Patient Details Name: Morgan SisLeona P Jenkins MRN: [id number] DOB: 1930-01-10 Today's Date: 04/18/2013 Time: [id number] PT Time Calculation (min): 25 min  PT Assessment / Plan / Recommendation  History of Present Illness Morgan Jenkins is an 80 y.o. female history of hypertension, hyperlipidemia, congestive heart failure and progressive dementia who was transferred [facility] for further management. Patient had an episode of loss of consciousness which lasted for about 2 hours. No seizure activity reported. Patient was unresponsive to external stimuli including noxious stimuli. Speech was noted to be slightly slurred prior to losing consciousness and on waking up. She is back to baseline state at this point according to her daughter. No focal weakness has been noted. Patient has had frequent recurrent falls. She's had a marked decline in cognitive functioning and increase in confusion over the past 5 weeks. Last fall was 3 days ago. CT scan of her head showed a small 3 mm linear subdural hematoma along the falx.   PT Comments   Patient making slow progress with mobility, that fluctuates with mental status changes.  At this point, do not feel patient can be safely managed at home.  Recommend SNF for continued therapy at discharge.  Follow Up Recommendations  SNF     Does the patient have the potential to tolerate intense rehabilitation     Barriers to Discharge        Equipment Recommendations  None recommended by PT    Recommendations for Other Services    Frequency Min 3X/week   Progress towards PT Goals Progress towards PT goals: Progressing toward goals (Slow progress)  Plan Discharge plan needs to be updated    Precautions / Restrictions Precautions Precautions: Fall Precaution Comments: daughter reports pt falls "all the time"; primarily at night when wandering  Required Braces or Orthoses: Other Brace/Splint Other  Brace/Splint: Lt AFO  Restrictions Weight Bearing Restrictions: No   Pertinent Vitals/Pain     Mobility  Bed Mobility Overal bed mobility: Needs Assistance Bed Mobility: Supine to Sit Supine to sit: Min assist;HOB elevated General bed mobility comments: Verbal cues for technique and sequencing.  Required HOB elevated, and increased time to scoot to EOB.  Total assist to don shoes/brace.  Once upright, patient with good sitting balance. Transfers Overall transfer level: Needs assistance Equipment used: Rolling walker (2 wheeled) Transfers: Sit to/from Stand Sit to Stand: Max assist;From elevated surface Stand pivot transfers: +2 physical assistance Squat pivot transfers: +2 physical assistance General transfer comment: Verbal cues for hand placement.  Assist to rise to standing. Verbal cues to move hands to RW and stand upright.  Once upright, patient with poor balance.  Verbal cues for sequencing and hand placement to sit into chair. Ambulation/Gait Ambulation/Gait assistance: Mod assist Ambulation Distance (Feet): 15 Feet Assistive device: Rolling walker (2 wheeled) Gait Pattern/deviations: Step-through pattern;Decreased step length - right;Decreased step length - left;Shuffle;Trunk flexed Gait velocity: decreased Gait velocity interpretation: Below normal speed for age/gender General Gait Details: Patient with flexed posture.  Cues to stand upright.  Assist to maneuver RW throughout gait, especially during turns.  Balance poor.      PT Goals (current goals can now be found in the care plan section)    Visit Information  Last PT Received On: 04/18/13 Assistance Needed: +1 History of Present Illness: Morgan Jenkins is an 80 y.o. female history of hypertension, hyperlipidemia, congestive heart failure and progressive dementia who was transferred from [facility] emergency  room for further management. Patient had an episode of loss of consciousness which  lasted for about 2 hours. No seizure activity reported. Patient was unresponsive to external stimuli including noxious stimuli. Speech was noted to be slightly slurred prior to losing consciousness and on waking up. She is back to baseline state at this point according to her daughter. No focal weakness has been noted. Patient has had frequent recurrent falls. She's had a marked decline in cognitive functioning and increase in confusion over the past 5 weeks. Last fall was 3 days ago. CT scan of her head showed a small 3 mm linear subdural hematoma along the falx.    Subjective Data  Subjective: I'll try.   Cognition  Cognition Arousal/Alertness: Awake/alert Behavior During Therapy: Restless;Anxious Overall Cognitive Status: History of cognitive impairments - at baseline Memory: Decreased short-term memory    Balance  Balance Overall balance assessment: History of Falls Standing balance support: Bilateral upper extremity supported Standing balance-Leahy Scale: Poor  End of Session PT - End of Session Equipment Utilized During Treatment: Gait belt (AFO LLE) Activity Tolerance: Patient limited by fatigue Patient left: in chair;with call bell/phone within reach;with chair alarm set;with family/visitor present Nurse Communication: Mobility status;Precautions   GP     Vena Austria 04/18/2013, 12:23 PM Durenda Hurt. Renaldo Fiddler, [facility] Acute Rehab Services Pager [phone]

## 2013-04-18 NOTE — Progress Notes (Signed)
Occupational Therapy Treatment Patient Details Name: Morgan Jenkins MRN: [id number] DOB: 07-Oct-1929 Today's Date: 04/18/2013 Time: 4098-1191 OT Time Calculation (min): 25 min  OT Assessment / Plan / Recommendation  History of present illness Morgan Jenkins is an 80 y.o. female history of hypertension, hyperlipidemia, congestive heart failure and progressive dementia who was transferred from [facility] emergency room for further management. Patient had an episode of loss of consciousness which lasted for about 2 hours. No seizure activity reported. Patient was unresponsive to external stimuli including noxious stimuli. Speech was noted to be slightly slurred prior to losing consciousness and on waking up. She is back to baseline state at this point according to her daughter. No focal weakness has been noted. Patient has had frequent recurrent falls. She's had a marked decline in cognitive functioning and increase in confusion over the past 5 weeks. Last fall was 3 days ago. CT scan of her head showed a small 3 mm linear subdural hematoma along the falx.   OT comments  Pt. Presenting with more noted confusion today affecting ability to follow one step commands and complete physical tasks safely.  Note hx of dementia.  Dtr. Present and aware of physical weakness paired with cognitive limitations making functional tasks/transfers very difficult at this time and not safe for one person to assist with.  rn present and had to assist as pt. Was unable to complete bsc transfer.  Dtr. Requests sw to help assist with d/c planning as she thinks home with care giver may not be a safe option at this time.                                  Progress towards OT Goals Progress towards OT goals: Not progressing toward goals - comment  Plan Discharge plan needs to be updated;Other (comment) (snf ), dtr. Requests sw to assist her with possible d/c changes   Precautions / Restrictions  Precautions Precautions: Fall Precaution Comments: daughter reports pt falls "all the time"; primarily at night when wandering  Required Braces or Orthoses: Other Brace/Splint Other Brace/Splint: Lt AFO    Pertinent Vitals/Pain Denies any pain    ADL  Lower Body Bathing: Performed;+1 Total assistance Where Assessed - Lower Body Bathing: Rolling right and/or left Lower Body Dressing: Performed;+1 Total assistance Where Assessed - Lower Body Dressing: Rolling right and/or left Toilet Transfer: Performed;+1 Total assistance Toilet Transfer Method: Stand pivot;Sit to Barista: Bedside commode Toileting - Clothing Manipulation and Hygiene: Performed;+1 Total assistance Where Assessed - Toileting Clothing Manipulation and Hygiene: Rolling right and/or left Transfers/Ambulation Related to ADLs: attempted stand pivot to bsc. with +1 assistance from rn.  pt. unable to sequence steps required to initiate or complete task.  easily distracted limiting ability to process 1 step directions.  assisted back to bed  +1 for all bed mobility ADL Comments:  required +1 total assist for all peri carea and don/doff briefs rolling l/r also with +1 total assist secondary to inconsistency wtih 1 step commands, high distractibility, and physical weakness       OT Goals(current goals can now be found in the care plan section)    Visit Information  Last OT Received On: 04/18/13 History of Present Illness: ARIYONNA Jenkins is an 80 y.o. female history of hypertension, hyperlipidemia, congestive heart failure and progressive dementia who was transferred from [facility] emergency room for further  management. Patient had an episode of loss of consciousness which lasted for about 2 hours. No seizure activity reported. Patient was unresponsive to external stimuli including noxious stimuli. Speech was noted to be slightly slurred prior to losing consciousness and on waking up.  She is back to baseline state at this point according to her daughter. No focal weakness has been noted. Patient has had frequent recurrent falls. She's had a marked decline in cognitive functioning and increase in confusion over the past 5 weeks. Last fall was 3 days ago. CT scan of her head showed a small 3 mm linear subdural hematoma along the falx.                 Cognition  Cognition Arousal/Alertness: Awake/alert Behavior During Therapy: Restless;Anxious Overall Cognitive Status: History of cognitive impairments - at baseline Memory: Decreased short-term memory    Mobility  Bed Mobility Overal bed mobility: Needs Assistance;+2 for physical assistance General bed mobility comments: total a +1/2 for sequencing and process of supine to sit, also for sit/supine.  Transfers Overall transfer level: Needs assistance Equipment used: 2 person hand held assist Transfers: Sit to/from [employer] CorporationStand;Stand Pivot Transfers Sit to Stand: +2 physical assistance Stand pivot transfers: +2 physical assistance Squat pivot transfers: +2 physical assistance General transfer comment: unable to complete transfer eob to bsc secondary to pt. perseverated that she had already urinated in adult brief and would try to sit during the transfer. noted to be unsafe and assisted back to bed for clean up and proper positioning in bed              End of Session OT - End of Session Activity Tolerance: Patient tolerated treatment well Patient left: in bed;with call bell/phone within reach;with bed alarm set;with family/visitor present Nurse Communication: Other (comment) (dtr present requests sw for d/c planning)       Robet LeuMorris, Zerline Melchior Lorraine, COTA/L 04/18/2013, 9:43 AM

## 2013-04-18 NOTE — Progress Notes (Signed)
[facility] PROGRESS NOTE  Adan SisLeona P Sipp VEL:[id number] DOB: 01-01-1930 DOA: 04/15/2013 PCP: No primary provider on file.  Assessment/Plan: #1 syncope Concern for possible seizures in the setting of dementia. MRI of the head was negative for acute infarct however did show a trace falcine subdural hematoma. No further episodes. EEG is within normal limits. Carotid ultrasound with no significant stenosis. 2-D echo with no source of emboli. Neurology following and appreciate input and recommendations.  #2 acute encephalopathy Likely secondary to worsening dementia versus probable seizures with underlying dementia. MRI of the head is negative for any acute infarction. EEG is normal. Patient close to her baseline. Follow.  #3 trace falcine subdural hematoma Patient has been seen by neurology no further workup needed at this time.  #4 hypertension Stable. Continue current regimen of lisinopril, Lopressor.   #5 advanced dementia Stable. Continue Depakote ER 500 mg at bedtime for mood/behavioral issues per neurology recommendations.  #5 prophylaxis SCDs for DVT prophylaxis.  Code Status: DO NOT RESUSCITATE Family Communication: Updated patient and daughter at bedside. Disposition Plan: SNF tommorrow   Consultants:  Neurology: Dr. Roseanne RenoStewart 04/15/2013  Procedures:  MRI/MRA of the head 04/16/2013  EEG 04/16/13  Carotid US 04/16/13  Antibiotics:  None  HPI/Subjective: Patient resting comfortably. No complaints.  Objective: Filed Vitals:   04/18/13 1331  BP: 133/87  Pulse: 80  Temp: 97.8 F (36.6 C)  Resp: 20    Intake/Output Summary (Last 24 hours) at 04/18/13 1437 Last data filed at 04/18/13 1330  Gross per 24 hour  Intake    560 ml  Output      0 ml  Net    560 ml   Filed Weights   04/15/13 2044  Weight: 74.299 kg (163 lb 12.8 oz)    Exam:   General:  NAD.   Cardiovascular: RRR  Respiratory: CTA B.  Abdomen: Soft, nontender, nondistended,  positive bowel sounds  Musculoskeletal: No clubbing cyanosis or edema. Left foot brace on.  Data Reviewed: Basic Metabolic Panel:  Recent Labs Lab 04/15/13 2225 04/17/13 0533 04/18/13 0332  NA  --  141 143  K  --  3.8 3.9  CL  --  102 101  CO2  --  25 26  GLUCOSE  --  123* 122*  BUN  --  14 17  CREATININE 0.62 0.62 0.64  CALCIUM  --  9.6 9.7   Liver Function Tests: No results found for this basename: AST, ALT, ALKPHOS, BILITOT, PROT, ALBUMIN,  in the last 168 hours No results found for this basename: LIPASE, AMYLASE,  in the last 168 hours No results found for this basename: AMMONIA,  in the last 168 hours CBC:  Recent Labs Lab 04/15/13 2225  WBC 6.7  HGB 10.9*  HCT 34.7*  MCV 83.4  PLT 148*   Cardiac Enzymes: No results found for this basename: CKTOTAL, CKMB, CKMBINDEX, TROPONINI,  in the last 168 hours BNP (last 3 results) No results found for this basename: PROBNP,  in the last 8760 hours CBG: No results found for this basename: GLUCAP,  in the last 168 hours  No results found for this or any previous visit (from the past 240 hour(s)).   Studies: No results found.  Scheduled Meds: . atorvastatin  80 mg Oral Daily  . calcium carbonate  500 mg of elemental calcium Oral BID WC  . cholecalciferol  1,000 Units Oral Daily  . clopidogrel  75 mg Oral Q breakfast  . divalproex  500 mg Oral  QHS  . DULoxetine  30 mg Oral Daily  . enoxaparin (LOVENOX) injection  40 mg Subcutaneous QHS  . lisinopril  20 mg Oral Daily  . metoprolol succinate  25 mg Oral Daily  . multivitamin with minerals  1 tablet Oral Daily  . pantoprazole  40 mg Oral Daily  . traMADol  50 mg Oral BID   Continuous Infusions:   Principal Problem:   Syncope Active Problems:   Encephalopathy   Progressive dementia with uncertain etiology   Unspecified essential hypertension   Subdural hematoma    Time spent: 35 minutes    Latrecia Capito M.D. [facility] Pager [phone]. If  7PM-7AM, please contact night-coverage at [email or web address], password [facility] 04/18/2013, 2:37 PM  LOS: 3 days

## 2013-04-19 NOTE — Progress Notes (Signed)
Occupational Therapy Treatment Patient Details Name: SHAKEA ISIP MRN: [id number] DOB: [birth year] Today's Date: 04/19/2013 Time: 1340-1400 OT Time Calculation (min): 20 min  OT Assessment / Plan / Recommendation  History of present illness XANDRIA GALLAGA is an 80 y.o. female history of hypertension, hyperlipidemia, congestive heart failure and progressive dementia who was transferred from [facility] emergency room for further management. Patient had an episode of loss of consciousness which lasted for about 2 hours. No seizure activity reported. Patient was unresponsive to external stimuli including noxious stimuli. Speech was noted to be slightly slurred prior to losing consciousness and on waking up. She is back to baseline state at this point according to her daughter. No focal weakness has been noted. Patient has had frequent recurrent falls. She's had a marked decline in cognitive functioning and increase in confusion over the past 5 weeks. Last fall was 3 days ago. CT scan of her head showed a small 3 mm linear subdural hematoma along the falx.   OT comments  Pt. Awake and in recliner upon arrival.  Difficult to complete functional movement as pt. Was perseverating on family members and their "plans".  Attempted sit/stand several times and pt. Unable to follow directions.  rn present and reports she also has conflicting reports and experiences with pt.s level of a. As she can go from +2 total to min a with cognitive status being a main factor in her abilities.  Still rec. snf   Follow Up Recommendations  SNF;Other (comment) (NOTE PT ALSO CHANGED TO SNF)                      Frequency Min 2X/week   Progress towards OT Goals Progress towards OT goals: Not progressing toward goals - comment (difficult to assess as pt. level of A changes throughout day)  Plan Discharge plan needs to be updated    Precautions / Restrictions Precautions Precautions:  Fall Precaution Comments: daughter reports pt falls "all the time"; primarily at night when wandering  Required Braces or Orthoses: Other Brace/Splint Other Brace/Splint: Lt AFO    Pertinent Vitals/Pain Denies any    ADL  Transfers/Ambulation Related to ADLs: attempted sit/stand from recliner in prep for functional transfers.  pt. perseverating on thoughts related to family members that were in the room.  attempted to bring feet down of recliner and pt. insisted that she needed to sit right hter and could not get up      OT Goals(current goals can now be found in the care plan section)    Visit Information  Last OT Received On: 04/19/13 History of Present Illness: WILENA TYNDALL is an 80 y.o. female history of hypertension, hyperlipidemia, congestive heart failure and progressive dementia who was transferred from [facility] emergency room for further management. Patient had an episode of loss of consciousness which lasted for about 2 hours. No seizure activity reported. Patient was unresponsive to external stimuli including noxious stimuli. Speech was noted to be slightly slurred prior to losing consciousness and on waking up. She is back to baseline state at this point according to her daughter. No focal weakness has been noted. Patient has had frequent recurrent falls. She's had a marked decline in cognitive functioning and increase in confusion over the past 5 weeks. Last fall was 3 days ago. CT scan of her head showed a small 3 mm linear subdural hematoma along the falx.    Subjective Data   "no  i need to sit right here" (in response to sit/stand)          Cognition  Cognition Arousal/Alertness: Awake/alert Behavior During Therapy: Restless;Anxious Overall Cognitive Status: History of cognitive impairments - at baseline Memory: Decreased short-term memory;Decreased recall of precautions    Mobility  Bed Mobility General bed mobility comments: up in  recliner upon arrival Transfers Overall transfer level: Needs assistance Sit to Stand: Max assist;Min assist General transfer comment: attempted sit/stand several trials.  pt. cognition con't. to limit consistency with function.  unable to bring into standing as pt. would not allow feet to be repositioned              End of Session OT - End of Session Patient left: in chair;with call bell/phone within reach;with family/visitor present       Robet LeuMorris, Katheleen Stella Lorraine, COTA/L 04/19/2013, 2:18 PM

## 2013-04-19 NOTE — Progress Notes (Signed)
[facility] PROGRESS NOTE  Adan SisLeona P Weigold ZOX:[id number] DOB: 08/26/1929 DOA: 04/15/2013 PCP: No primary provider on file.  Assessment/Plan: #1 syncope Concern for possible seizures in the setting of dementia. MRI of the head was negative for acute infarct however did show a trace falcine subdural hematoma. No further episodes. EEG is within normal limits. Carotid ultrasound with no significant stenosis. 2-D echo with no source of emboli. Neurology following and appreciate input and recommendations.  #2 acute encephalopathy Likely secondary to worsening dementia versus probable seizures with underlying dementia. MRI of the head is negative for any acute infarction. EEG is normal. Patient close to her baseline. Follow.  #3 trace falcine subdural hematoma Patient has been seen by neurology no further workup needed at this time.  #4 hypertension Stable. Continue current regimen of lisinopril, Lopressor.   #5 advanced dementia Stable. Continue Depakote ER 500 mg at bedtime for mood/behavioral issues per neurology recommendations.  #5 prophylaxis SCDs for DVT prophylaxis.  Code Status: DO NOT RESUSCITATE Family Communication: Updated patient and daughter at bedside. Disposition Plan: SNF tomorrow when bed available   Consultants:  Neurology: Dr. Roseanne RenoStewart 04/15/2013  Procedures:  MRI/MRA of the head 04/16/2013  EEG 04/16/13  Carotid US 04/16/13  Antibiotics:  None  HPI/Subjective: No complaints.  Objective: Filed Vitals:   04/19/13 1006  BP: 145/53  Pulse: 96  Temp: 97.8 F (36.6 C)  Resp: 20   No intake or output data in the 24 hours ending 04/19/13 1435 Filed Weights   04/15/13 2044  Weight: 74.299 kg (163 lb 12.8 oz)    Exam:   General:  NAD.   Cardiovascular: RRR  Respiratory: CTA B.  Abdomen: Soft, nontender, nondistended, positive bowel sounds  Musculoskeletal: No clubbing cyanosis or edema. Left foot brace on.  Data Reviewed: Basic  Metabolic Panel:  Recent Labs Lab 04/15/13 2225 04/17/13 0533 04/18/13 0332  NA  --  141 143  K  --  3.8 3.9  CL  --  102 101  CO2  --  25 26  GLUCOSE  --  123* 122*  BUN  --  14 17  CREATININE 0.62 0.62 0.64  CALCIUM  --  9.6 9.7   Liver Function Tests: No results found for this basename: AST, ALT, ALKPHOS, BILITOT, PROT, ALBUMIN,  in the last 168 hours No results found for this basename: LIPASE, AMYLASE,  in the last 168 hours No results found for this basename: AMMONIA,  in the last 168 hours CBC:  Recent Labs Lab 04/15/13 2225  WBC 6.7  HGB 10.9*  HCT 34.7*  MCV 83.4  PLT 148*   Cardiac Enzymes: No results found for this basename: CKTOTAL, CKMB, CKMBINDEX, TROPONINI,  in the last 168 hours BNP (last 3 results) No results found for this basename: PROBNP,  in the last 8760 hours CBG: No results found for this basename: GLUCAP,  in the last 168 hours  No results found for this or any previous visit (from the past 240 hour(s)).   Studies: No results found.  Scheduled Meds: . atorvastatin  80 mg Oral Daily  . calcium carbonate  500 mg of elemental calcium Oral BID WC  . cholecalciferol  1,000 Units Oral Daily  . clopidogrel  75 mg Oral Q breakfast  . divalproex  500 mg Oral QHS  . DULoxetine  30 mg Oral Daily  . enoxaparin (LOVENOX) injection  40 mg Subcutaneous QHS  . lisinopril  20 mg Oral Daily  . metoprolol succinate  25  mg Oral Daily  . multivitamin with minerals  1 tablet Oral Daily  . pantoprazole  40 mg Oral Daily  . traMADol  50 mg Oral BID   Continuous Infusions:   Principal Problem:   Syncope Active Problems:   Encephalopathy   Progressive dementia with uncertain etiology   Unspecified essential hypertension   Subdural hematoma    Time spent: 35 minutes    THOMPSON,DANIEL M.D. [facility] Pager [phone]. If 7PM-7AM, please contact night-coverage at [email or web address], password [facility] 04/19/2013, 2:35 PM  LOS: 4 days

## 2013-04-19 NOTE — Progress Notes (Signed)
Clinical Social Worker (CSW) contacted patient's daughter Andres ShadDedra Malcolm [phone] and gave bed offers. Daughter chose bed [facility]. CSW contacted admissions coordinator Clydie BraunKaren who reported that patient can D/C to facility tomorrow (Sunday 2/1). CSW left daughter a message making her aware of above. Nursing is aware of above. CSW will assist with D/C tomorrow.   [name], LCSWA Weekend CSW [phone]

## 2013-04-20 ENCOUNTER — Ambulatory Visit: Payer: Self-pay | Admitting: Nurse Practitioner

## 2013-04-20 DIAGNOSIS — N390 Urinary tract infection, site not specified: Secondary | ICD-10-CM | POA: Clinically undetermined

## 2013-04-20 LAB — URINE MICROSCOPIC-ADD ON

## 2013-04-20 LAB — URINALYSIS, ROUTINE W REFLEX MICROSCOPIC
Bilirubin Urine: NEGATIVE
Glucose, UA: NEGATIVE mg/dL
Hgb urine dipstick: NEGATIVE
Ketones, ur: 15 mg/dL — AB
Leukocytes, UA: NEGATIVE
NITRITE: POSITIVE — AB
PH: 7 (ref 5.0–8.0)
Protein, ur: NEGATIVE mg/dL
SPECIFIC GRAVITY, URINE: 1.022 (ref 1.005–1.030)
Urobilinogen, UA: 0.2 mg/dL (ref 0.0–1.0)

## 2013-04-20 MED ORDER — NITROFURANTOIN MONOHYD MACRO 100 MG PO CAPS
100.0000 mg | ORAL_CAPSULE | Freq: Two times a day (BID) | ORAL | Status: DC
  Administered 2013-04-20: 100 mg via ORAL
  Filled 2013-04-20 (×2): qty 1

## 2013-04-20 MED ORDER — DIVALPROEX SODIUM ER 500 MG PO TB24: 500.0000 mg | ORAL_TABLET | Freq: Every day | ORAL | Status: AC

## 2013-04-20 MED ORDER — DEXTROSE 5 % IV SOLN: 1.0000 g | INTRAVENOUS | Status: DC

## 2013-04-20 MED ORDER — CLOPIDOGREL BISULFATE 75 MG PO TABS
75.0000 mg | ORAL_TABLET | Freq: Every day | ORAL | Status: DC
Start: 2013-04-20 — End: 2013-04-20

## 2013-04-20 MED ORDER — TRAMADOL HCL 50 MG PO TABS: 50.0000 mg | ORAL_TABLET | Freq: Two times a day (BID) | ORAL | Status: AC

## 2013-04-20 MED ORDER — NITROFURANTOIN MONOHYD MACRO 100 MG PO CAPS: 100.0000 mg | ORAL_CAPSULE | Freq: Two times a day (BID) | ORAL | Status: AC

## 2013-04-20 MED ORDER — DIVALPROEX SODIUM ER 500 MG PO TB24: 500.0000 mg | ORAL_TABLET | Freq: Every day | ORAL | Status: DC

## 2013-04-20 NOTE — Progress Notes (Signed)
Patient is medically stable for D/C today to [facility]. Clinical Child psychotherapistocial Worker (CSW) prepared D/C packet and arranged non-emergency EMS ([facility]) for transport. Patient's daughter Morgan Jenkins is aware of above. Nursing is aware of above. Please reconsult if further social work needs arise. CSW signing off.   [name], LCSWA Weekend CSW [phone]

## 2013-04-20 NOTE — Progress Notes (Signed)
Pt d/c to snf by ambulance. Assessment stable. 

## 2013-04-20 NOTE — Discharge Summary (Signed)
Physician Discharge Summary  ESMAE DONATHAN RUE:[id number] DOB: 05-Sep-1929 DOA: 04/15/2013  PCP: Tamsen Roers, MD  Admit date: 04/15/2013 Discharge date: 04/20/2013  Time spent: 65 minutes  Recommendations for Outpatient Follow-up:  1. Followup with M.D. at the skilled nursing facility. Urine cultures will need to be followed up upon at the facility. Patient is being discharged on Macrobid twice daily total of one week. 2. Follow with CHRISMON, DENNIS E, MD in 1 week. On followup urine cultures will need to be followed up upon as patient is being discharged on Macrobid twice daily for total of one week antibiotic treatment.  Discharge Diagnoses:  Principal Problem:   Syncope Active Problems:   Encephalopathy   Progressive dementia with uncertain etiology   Unspecified essential hypertension   Subdural hematoma   UTI (urinary tract infection)   Discharge Condition: Stable and improved  Diet recommendation: Regular  Filed Weights   04/15/13 2044  Weight: 74.299 kg (163 lb 12.8 oz)    History of present illness:  This is a 80 y.o. year old female with significant past medical history of  HTN, Dementia, CVA presenting with progressing AMS x 2-3 weeks. Daughter states that pt has been progressively confused since christmas with pt not remembering loved ones and orientation. Has also had recurring falls at home. Does have 24 hour care at home. Today, daughter received call from cargiver about pt being minimally responsive at home. Pt had to be manually moved from bed to wheelchair. Pt was also unable to recognize close family member. EMS was called, per daughter blood sugar and BP WNL.  Pt was sent to [facility] for further evaluation. On presentation, pt was noted to be dysarthric and confused. CBC, CMET, UA and CXR WNL. Had head CT that was negative for any acute intracranial abnormality. Did show tiny subdural hematoma in flax region. Case was discussed with neuro. EDP was concerned  about subdural hematoma. Neuro felt sxs likely 2/2 CVA recurrence with need for CVA workup. [facility] of [facility]ural hematoma. Last fall was 3 days ago per daughter. Currently on full dose ASA. Pt conversive on presentation to [facility]. Pt currently denies any CP, SOB. Does have some L sided weakness that is fairly chronic. No acute worsening. AxO x3.    Hospital Course:  #1 syncope  Patient was admitted with dysarthria confusion and a syncopal episode. Concern for possible seizures in the setting of dementia. MRI of the head was negative for acute infarct however did show a trace falcine subdural hematoma.  EEG is within normal limits. Carotid ultrasound with no significant stenosis. 2-D echo with no source of emboli. Patient improved clinically did not have any further syncopal episodes. Patient was also seen in consultation by neurology who was in agreement with the workup which was essentially negative. On admission patient's aspirin has been discontinued and patient had been placed on Plavix which she tolerated. It was felt per neurology as to whether patient may have had a seizure with prolonged postictal state in a patient with advanced dementia. Patient was also started on Depakote 500 mg daily for possible seizure and also for mood stabilization. Patient be discharged in stable and improved condition.  #2 acute encephalopathy  Likely secondary to worsening dementia versus probable seizures with underlying dementia. MRI of the head is negative for any acute infarction. EEG is normal. Patient improved clinically and will be discharged in stable and improved condition.   #3 trace falcine subdural hematoma  Patient has been seen by neurology no further workup needed at this time.  #4 hypertension  Stable. Continued on her home regimen of lisinopril, Lopressor.  #5 advanced dementia  Stable. Depakote was added as neurology had some concern as to whether patient may  have had a seizure with prolonged postictal state. It was also noted that patient did have some  m ood/behavioral issues from her dementia and was felt that Depakote would also help with this. Patient was discharged in stable and improved condition.  #6 probable urinary tract infection On day of discharge it was noted that patient had some foul-smelling urine. Urinalysis which was obtained was consistent with a urinary tract infection. Urine cultures were pending at the time of discharge. Patient was started on Macrobid 100 mg twice daily secondary to history of allergies to ciprofloxacin and penicillin. Patient will be treated for total of 7 days. Urine cultures will need to be followed up upon at the skilled nursing facility. Patient remained afebrile throughout the hospitalization and patient will be discharged in stable and improved condition.      Procedures: MRI/MRA of the head 04/16/2013  EEG 04/16/13  Carotid US 04/16/13   Consultations: Neurology: Dr. Roseanne Reno 04/15/2013   Discharge Exam: Filed Vitals:   04/20/13 1401  BP: 127/50  Pulse: 76  Temp: 97.8 F (36.6 C)  Resp: 18    General: NAD Cardiovascular: RRR Respiratory: CTAB  Discharge Instructions      Discharge Orders   Future Orders Complete By Expires   Diet general  As directed    Discharge instructions  As directed    Comments:     Follow up with MD at SNF. Followup with CHRISMON, DENNIS E, MD in 1 week.   Increase activity slowly  As directed        Medication List    STOP taking these medications       meloxicam 15 MG tablet  Commonly known as:  MOBIC      TAKE these medications       acetaminophen 325 MG tablet  Commonly known as:  TYLENOL  Take 650 mg by mouth every 6 (six) hours as needed for mild pain.     aspirin 325 MG tablet  Take 325 mg by mouth daily.     atorvastatin 80 MG tablet  Commonly known as:  LIPITOR  Take 80 mg by mouth daily.     calcium carbonate 600 MG Tabs  tablet  Commonly known as:  OS-CAL  Take 600 mg by mouth 2 (two) times daily with a meal.     cholecalciferol 1000 UNITS tablet  Commonly known as:  VITAMIN D  Take 1,000 Units by mouth daily.     divalproex 500 MG 24 hr tablet  Commonly known as:  DEPAKOTE ER  Take 1 tablet (500 mg total) by mouth at bedtime.     docusate sodium 100 MG capsule  Commonly known as:  COLACE  Take 100 mg by mouth 2 (two) times daily as needed for mild constipation.     DULoxetine 30 MG capsule  Commonly known as:  CYMBALTA  Take 30 mg by mouth daily.     FIBER CHOICE PO  Take by mouth daily.     furosemide 20 MG tablet  Commonly known as:  LASIX  Take 20 mg by mouth daily as needed for fluid.     lisinopril 20 MG tablet  Commonly known as:  PRINIVIL,ZESTRIL  Take 20 mg by  mouth daily.     metoprolol succinate 25 MG 24 hr tablet  Commonly known as:  TOPROL-XL  Take 25 mg by mouth daily.     multivitamin with minerals Tabs tablet  Take 1 tablet by mouth daily.     nitrofurantoin (macrocrystal-monohydrate) 100 MG capsule  Commonly known as:  MACROBID  Take 1 capsule (100 mg total) by mouth every 12 (twelve) hours. Take for 7 days then stop.     NITROSTAT 0.4 MG SL tablet  Generic drug:  nitroGLYCERIN  Place 0.4 mg under the tongue every 5 (five) minutes as needed for chest pain.     pantoprazole 40 MG tablet  Commonly known as:  PROTONIX  Take 40 mg by mouth daily.     traMADol 50 MG tablet  Commonly known as:  ULTRAM  Take 1 tablet (50 mg total) by mouth 2 (two) times daily.       Allergies  Allergen Reactions  . Biaxin [Clarithromycin]     unknown  . Ciprofloxacin Rash  . Penicillins Rash   Follow-up Information   Please follow up. (F/U WITH MD AT SNF)        The results of significant diagnostics from this hospitalization (including imaging, microbiology, ancillary and laboratory) are listed below for reference.    Significant Diagnostic Studies: Mr Brain Wo  Contrast  04/16/2013   CLINICAL DATA:  Episode of unresponsiveness, possibly syncope or less likely seizure. Underlying progressive dementia. Rule out stroke.  EXAM: MRI HEAD WITHOUT CONTRAST  MRA HEAD WITHOUT CONTRAST  TECHNIQUE: Multiplanar, multiecho pulse sequences of the brain and surrounding structures were obtained without intravenous contrast. Angiographic images of the head were obtained using MRA technique without contrast.  COMPARISON:  Head CT 04/15/2013 and brain MRI 10/28/2011  FINDINGS: MRI HEAD FINDINGS  Images are mildly degraded by motion artifact. Susceptibility artifact is seen in the anterior left frontal lobe at the site of prior hemorrhage. Small amount of susceptibility artifact along the left aspect of the falx corresponds to small subdural hematoma described on recent CT. There punctate foci of susceptibility artifact peripherally within the cerebral hemispheres, most prominently in the right temporoparietal, bilateral frontal, and occipital regions, compatible with remote microhemorrhages. Foci of T2 hyperintensity within the periventricular white matter have mildly increased from the prior study and are nonspecific but compatible with moderate chronic small vessel ischemic disease.  Incidental note is made of a cavum septum pellucidum and vergae. There is no evidence of acute infarct. There is moderate cerebral atrophy. There is no mass or midline shift. Major intracranial vascular flow voids are unremarkable. Prior bilateral cataract surgeries noted. The visualized paranasal sinuses and mastoid air cells are clear.  MRA HEAD FINDINGS  Images are mildly degraded by motion artifact. The visualized distal vertebral arteries are patent. PICA origins are patent. There is mild irregularity of the distal left vertebral artery without focal stenosis. SCA origins are patent. The left P1 segment is mildly hypoplastic with a prominent left posterior communicating artery present. PCAs are otherwise  unremarkable.  Internal carotid arteries are patent from skull base to carotid termini. ACA and MCA origins and visualized branches are patent without evidence of significant stenosis. No intracranial aneurysm is identified.  IMPRESSION: 1. No evidence of acute infarct. 2. Trace falcine subdural hematoma. 3. Small foci of remote hemorrhage scattered throughout both cerebral hemispheres, nonspecific but could be compatible with amyloid angiopathy. 4. Moderate chronic small vessel ischemic disease, mildly progressed from prior MRI. 5. No evidence of  major intracranial arterial occlusion or high-grade stenosis. Mild irregularity of the distal left vertebral artery.   Electronically Signed   By: Sebastian Ache   On: 04/16/2013 10:10   Mr Mra Head/brain Wo Cm  04/16/2013   CLINICAL DATA:  Episode of unresponsiveness, possibly syncope or less likely seizure. Underlying progressive dementia. Rule out stroke.  EXAM: MRI HEAD WITHOUT CONTRAST  MRA HEAD WITHOUT CONTRAST  TECHNIQUE: Multiplanar, multiecho pulse sequences of the brain and surrounding structures were obtained without intravenous contrast. Angiographic images of the head were obtained using MRA technique without contrast.  COMPARISON:  Head CT 04/15/2013 and brain MRI 10/28/2011  FINDINGS: MRI HEAD FINDINGS  Images are mildly degraded by motion artifact. Susceptibility artifact is seen in the anterior left frontal lobe at the site of prior hemorrhage. Small amount of susceptibility artifact along the left aspect of the falx corresponds to small subdural hematoma described on recent CT. There punctate foci of susceptibility artifact peripherally within the cerebral hemispheres, most prominently in the right temporoparietal, bilateral frontal, and occipital regions, compatible with remote microhemorrhages. Foci of T2 hyperintensity within the periventricular white matter have mildly increased from the prior study and are nonspecific but compatible with moderate  chronic small vessel ischemic disease.  Incidental note is made of a cavum septum pellucidum and vergae. There is no evidence of acute infarct. There is moderate cerebral atrophy. There is no mass or midline shift. Major intracranial vascular flow voids are unremarkable. Prior bilateral cataract surgeries noted. The visualized paranasal sinuses and mastoid air cells are clear.  MRA HEAD FINDINGS  Images are mildly degraded by motion artifact. The visualized distal vertebral arteries are patent. PICA origins are patent. There is mild irregularity of the distal left vertebral artery without focal stenosis. SCA origins are patent. The left P1 segment is mildly hypoplastic with a prominent left posterior communicating artery present. PCAs are otherwise unremarkable.  Internal carotid arteries are patent from skull base to carotid termini. ACA and MCA origins and visualized branches are patent without evidence of significant stenosis. No intracranial aneurysm is identified.  IMPRESSION: 1. No evidence of acute infarct. 2. Trace falcine subdural hematoma. 3. Small foci of remote hemorrhage scattered throughout both cerebral hemispheres, nonspecific but could be compatible with amyloid angiopathy. 4. Moderate chronic small vessel ischemic disease, mildly progressed from prior MRI. 5. No evidence of major intracranial arterial occlusion or high-grade stenosis. Mild irregularity of the distal left vertebral artery.   Electronically Signed   By: Sebastian Ache   On: 04/16/2013 10:10    Microbiology: No results found for this or any previous visit (from the past 240 hour(s)).   Labs: Basic Metabolic Panel:  Recent Labs Lab 04/15/13 2225 04/17/13 0533 04/18/13 0332  NA  --  141 143  K  --  3.8 3.9  CL  --  102 101  CO2  --  25 26  GLUCOSE  --  123* 122*  BUN  --  14 17  CREATININE 0.62 0.62 0.64  CALCIUM  --  9.6 9.7   Liver Function Tests: No results found for this basename: AST, ALT, ALKPHOS, BILITOT,  PROT, ALBUMIN,  in the last 168 hours No results found for this basename: LIPASE, AMYLASE,  in the last 168 hours No results found for this basename: AMMONIA,  in the last 168 hours CBC:  Recent Labs Lab 04/15/13 2225  WBC 6.7  HGB 10.9*  HCT 34.7*  MCV 83.4  PLT 148*   Cardiac Enzymes:  No results found for this basename: CKTOTAL, CKMB, CKMBINDEX, TROPONINI,  in the last 168 hours BNP: BNP (last 3 results) No results found for this basename: PROBNP,  in the last 8760 hours CBG: No results found for this basename: GLUCAP,  in the last 168 hours     Signed:  [facility] MD [facility] 04/20/2013, 2:49 PM

## 2013-04-20 NOTE — Progress Notes (Signed)
Physical Therapy Treatment Patient Details Name: Adan SisLeona P Mcglaughlin MRN: [id number] DOB: 10-Oct-1929 Today's Date: 04/20/2013 Time: [id number] PT Time Calculation (min): 26 min  PT Assessment / Plan / Recommendation  History of Present Illness Adan SisLeona P Vizcarrondo is an 80 y.o. female history of hypertension, hyperlipidemia, congestive heart failure and progressive dementia who was transferred from [facility] emergency room for further management. Patient had an episode of loss of consciousness which lasted for about 2 hours. No seizure activity reported. Patient was unresponsive to external stimuli including noxious stimuli. Speech was noted to be slightly slurred prior to losing consciousness and on waking up. She is back to baseline state at this point according to her daughter. No focal weakness has been noted. Patient has had frequent recurrent falls. She's had a marked decline in cognitive functioning and increase in confusion over the past 5 weeks. Last fall was 3 days ago. CT scan of her head showed a small 3 mm linear subdural hematoma along the falx.   PT Comments   Patient continues to demonstrate very modest progress. Poor attention and mobility related to RLE. Moderate to maximal assist for ambulation of short distance.    Follow Up Recommendations  SNF           Equipment Recommendations  None recommended by PT       Frequency Min 3X/week   Progress towards PT Goals Progress towards PT goals: Progressing toward goals (slow modest progress)  Plan Discharge plan needs to be updated    Precautions / Restrictions Precautions Precautions: Fall Precaution Comments: daughter reports pt falls "all the time"; primarily at night when wandering  Required Braces or Orthoses: Other Brace/Splint Other Brace/Splint: Lt AFO  Restrictions Weight Bearing Restrictions: No   Pertinent Vitals/Pain No pain at this time.    Mobility  Bed Mobility Overal bed mobility: Needs  Assistance Bed Mobility: Supine to Sit Supine to sit: Min assist;HOB elevated General bed mobility comments: Verbal cues for technique and sequencing.  Required HOB elevated, and increased time to scoot to EOB.  Total assist to don shoes/brace.  Once upright, patient with good sitting balance. Transfers Overall transfer level: Needs assistance Equipment used: Rolling walker (2 wheeled) Transfers: Sit to/from Stand Sit to Stand: Max assist;From elevated surface General transfer comment: Verbal cues for hand placement.  Assist to rise to standing. Verbal cues to move hands to RW and stand upright.  Once upright, patient with poor balance.  Verbal cues for sequencing and hand placement to sit into chair. Ambulation/Gait Ambulation/Gait assistance: Mod assist Ambulation Distance (Feet): 22 Feet Assistive device: Rolling walker (2 wheeled) Gait Pattern/deviations: Step-through pattern;Decreased step length - right;Decreased step length - left;Decreased stride length;Shuffle;Trunk flexed Gait velocity: decreased General Gait Details: Patient with poor posture and position within RW, mod to max assist to correct. Max cues for RLE step and placing. Manual assist to initiate stride with RLE. Poor attention to Right side during gait.       PT Goals (current goals can now be found in the care plan section) Acute Rehab PT Goals PT Goal Formulation: With patient/family Time For Goal Achievement: 04/30/13 Potential to Achieve Goals: Fair  Visit Information  Last PT Received On: 04/20/13 Assistance Needed: +1 History of Present Illness: Adan SisLeona P Michael is an 80 y.o. female history of hypertension, hyperlipidemia, congestive heart failure and progressive dementia who was transferred from [facility] emergency room for further management. Patient had an episode of loss of consciousness which  lasted for about 2 hours. No seizure activity reported. Patient was unresponsive to external  stimuli including noxious stimuli. Speech was noted to be slightly slurred prior to losing consciousness and on waking up. She is back to baseline state at this point according to her daughter. No focal weakness has been noted. Patient has had frequent recurrent falls. She's had a marked decline in cognitive functioning and increase in confusion over the past 5 weeks. Last fall was 3 days ago. CT scan of her head showed a small 3 mm linear subdural hematoma along the falx.    Subjective Data  Subjective: I'll try.   Cognition  Cognition Arousal/Alertness: Awake/alert Behavior During Therapy: WFL for tasks assessed/performed Overall Cognitive Status: Impaired/Different from baseline Area of Impairment: Orientation;Attention;Following commands;Safety/judgement;Awareness;Problem solving Memory: Decreased short-term memory Following Commands: Follows one step commands inconsistently Safety/Judgement: Decreased awareness of safety;Decreased awareness of deficits Awareness: Intellectual Problem Solving: Slow processing;Decreased initiation;Difficulty sequencing;Requires verbal cues;Requires tactile cues    Balance  Balance Overall balance assessment: History of Falls Sitting balance-Leahy Scale: Fair Postural control: Posterior lean Standing balance support: Bilateral upper extremity supported Standing balance-Leahy Scale: Poor  End of Session PT - End of Session Equipment Utilized During Treatment: Gait belt (AFO LLE) Activity Tolerance: Patient limited by fatigue Patient left: in bed;with call bell/phone within reach;with bed alarm set;with family/visitor present Nurse Communication: Mobility status;Precautions   GP     Fabio Asa 04/20/2013, 3:13 PM Charlotte Crumb, PT DPT  [phone]

## 2013-04-21 NOTE — Care Management Note (Signed)
    Page 1 of 2   04/21/2013     10:08:56 AM   CARE MANAGEMENT NOTE 04/21/2013  Patient:  Morgan Jenkins, Morgan Jenkins   Account Number:  [id number]  Date Initiated:  04/17/2013  Documentation initiated by:  Lorne Skeens  Subjective/Objective Assessment:   Patient admitted for CVA. Lives at home iwth daughter.  Has a private duty sitter/24hr supervision     Action/Plan:   Will follow for discharge needs   Anticipated DC Date:  04/17/2013   Anticipated DC Plan:  [place]  CM consult      Choice offered to / List presented to:  C-4 Adult Children        HH arranged  HH-2 PT      [place]   Status of service:   Medicare Important Message given?   (If response is "NO", the following Medicare IM given date fields will be blank) Date Medicare IM given:   Date Additional Medicare IM given:    Discharge Disposition:    Per UR Regulation:    If discussed at Long Length of Stay Meetings, dates discussed:    Comments:  04/21/13 Lorne Skeens RN, MSN, CM-Voicemail left for April at [facility] to verify that she was aware of patient's transfer to SNF over the weekend.   04/18/13 [place] RN, MSN, CM- CM spoke with patient's RN, who states that family no longer feels that patient is strong enough to take home.  PT's recommendation from 04/17/13 states home health.  CM and CSW met with family, who feels that patient has become weaker since previous eval.  CM spoke with PT assigned to patient today and requested that patient be seen early in the day so the most up to date PT note can be sent to patient's insurance company.  CSW to pursue SNF placement.  Family is aware that Dr Grandville Silos feels patient is medically ready for discharge home today, so HH and DME plans will remain in place until disposition has been confirmed.  Family is in agreement with this plan.   04/17/13 [place], MSN,  CM- CM spoke with patient's daughter Morgan Jenkins [phone] regarding home health orders.  Daughter has declined HH OT/SLP, but is interested in [place].  Daughter has requested [facility].  CM spoke with April and faxed requested information to [phone].  Patient's daughter's phone number was included in paperwork as the primary contact.  Patient's PCP is Dr Natale Milch in [place], which was also added to the information faxed.  CM will follow up to ensure that referral has been accepted.

## 2013-04-22 LAB — URINE CULTURE

## 2013-04-28 ENCOUNTER — Emergency Department: Payer: Self-pay | Admitting: Emergency Medicine

## 2013-04-28 LAB — CBC
HCT: 39.9 % (ref 35.0–47.0)
HGB: 12.7 g/dL (ref 12.0–16.0)
MCH: 26.3 pg (ref 26.0–34.0)
MCHC: 31.8 g/dL — ABNORMAL LOW (ref 32.0–36.0)
MCV: 83 fL (ref 80–100)
PLATELETS: 186 10*3/uL (ref 150–440)
RBC: 4.83 10*6/uL (ref 3.80–5.20)
RDW: 15.2 % — AB (ref 11.5–14.5)
WBC: 9.8 10*3/uL (ref 3.6–11.0)

## 2013-04-28 LAB — COMPREHENSIVE METABOLIC PANEL
ALT: 12 U/L (ref 12–78)
AST: 16 U/L (ref 15–37)
Albumin: 3.1 g/dL — ABNORMAL LOW (ref 3.4–5.0)
Alkaline Phosphatase: 80 U/L
Anion Gap: 4 — ABNORMAL LOW (ref 7–16)
BILIRUBIN TOTAL: 0.7 mg/dL (ref 0.2–1.0)
BUN: 20 mg/dL — AB (ref 7–18)
CHLORIDE: 100 mmol/L (ref 98–107)
CO2: 32 mmol/L (ref 21–32)
Calcium, Total: 10.4 mg/dL — ABNORMAL HIGH (ref 8.5–10.1)
Creatinine: 0.67 mg/dL (ref 0.60–1.30)
EGFR (African American): >60
Glucose: 114 mg/dL — ABNORMAL HIGH (ref 65–99)
Osmolality: 275 (ref 275–301)
POTASSIUM: 4.3 mmol/L (ref 3.5–5.1)
SODIUM: 136 mmol/L (ref 136–145)
Total Protein: 7.5 g/dL (ref 6.4–8.2)

## 2013-04-28 LAB — URINALYSIS, COMPLETE
BILIRUBIN, UR: NEGATIVE
GLUCOSE, UR: NEGATIVE mg/dL (ref 0–75)
NITRITE: POSITIVE
PH: 7 (ref 4.5–8.0)
Protein: 100
RBC,UR: 230 /HPF (ref 0–5)
SPECIFIC GRAVITY: 1.016 (ref 1.003–1.030)
Squamous Epithelial: 1
WBC UR: 80 /HPF (ref 0–5)

## 2013-04-28 LAB — PROTIME-INR
INR: 1
Prothrombin Time: 12.9 secs (ref 11.5–14.7)

## 2013-04-28 LAB — TROPONIN I: Troponin-I: <0.02 ng/mL

## 2013-04-28 LAB — VALPROIC ACID LEVEL: VALPROIC ACID: 73 ug/mL

## 2013-05-04 ENCOUNTER — Emergency Department: Payer: Self-pay | Admitting: Emergency Medicine

## 2013-05-04 LAB — COMPREHENSIVE METABOLIC PANEL
ALBUMIN: 2.9 g/dL — AB (ref 3.4–5.0)
ALK PHOS: 77 U/L
ALT: 10 U/L — AB (ref 12–78)
AST: 22 U/L (ref 15–37)
Anion Gap: 6 — ABNORMAL LOW (ref 7–16)
BUN: 21 mg/dL — ABNORMAL HIGH (ref 7–18)
Bilirubin,Total: 0.4 mg/dL (ref 0.2–1.0)
Calcium, Total: 10.4 mg/dL — ABNORMAL HIGH (ref 8.5–10.1)
Chloride: 102 mmol/L (ref 98–107)
Co2: 27 mmol/L (ref 21–32)
Creatinine: 0.62 mg/dL (ref 0.60–1.30)
EGFR (African American): >60
EGFR (Non-African Amer.): >60
Glucose: 110 mg/dL — ABNORMAL HIGH (ref 65–99)
OSMOLALITY: 274 (ref 275–301)
Potassium: 4.4 mmol/L (ref 3.5–5.1)
SODIUM: 135 mmol/L — AB (ref 136–145)
Total Protein: 6.9 g/dL (ref 6.4–8.2)

## 2013-05-04 LAB — CBC
HCT: 38.1 % (ref 35.0–47.0)
HGB: 12.1 g/dL (ref 12.0–16.0)
MCH: 26.3 pg (ref 26.0–34.0)
MCHC: 31.8 g/dL — AB (ref 32.0–36.0)
MCV: 83 fL (ref 80–100)
PLATELETS: 178 10*3/uL (ref 150–440)
RBC: 4.61 10*6/uL (ref 3.80–5.20)
RDW: 14.8 % — ABNORMAL HIGH (ref 11.5–14.5)
WBC: 10.9 10*3/uL (ref 3.6–11.0)

## 2013-05-04 LAB — VALPROIC ACID LEVEL: Valproic Acid: 48 ug/mL — ABNORMAL LOW

## 2013-06-18 DEATH — deceased

## 2014-07-07 NOTE — Discharge Summary (Signed)
PATIENT NAME:  Morgan Jenkins, TEED MR#:  [id number] DATE OF BIRTH:  12-Nov-1929  DATE OF ADMISSION:  10/26/2011 DATE OF DISCHARGE:  10/29/2011  ADMITTING DIAGNOSIS:  Altered mental status.  DISCHARGE DIAGNOSES: 1. Altered mental status likely related to acute evolving left frontal infarct, thrombotic likely. Known history of carotid artery stenosis.  2. Progressive Alzheimer's type dementia with sundowning.  3. History of hypertension, hyperlipidemia, gastroesophageal reflux disease, depression, intermittent hypoxia of unclear etiology, in the past there was the question of possible atelectasis versus chronic obstructive pulmonary disease, none noted this admission.  4. History of neuropathy.   DISCHARGE CONDITION: Stable.   DISCHARGE MEDICATIONS: The patient is to resume her outpatient medications which are: 1. Cymbalta 30 mg p.o. daily.  2. Metoprolol succinate 25 mg p.o. daily. 3. Lisinopril 10 mg p.o. daily. 4. Vitamin D3 1000 units daily.  5. Lasix 20 mg p.o. as needed for weight gain of more than 2 pounds in a week.  6. Colace 100 mg p.o. twice daily as needed.  7. Protonix 40 mg p.o. daily.  8. Aricept 5 mg p.o. daily.  9. Lipitor 80 mg p.o. daily.  10. Tramadol 50 mg p.o., 1 to 4 times daily.  11. Multivitamin once daily.  12. Mobic 15 mg p.o. daily.  13. Calcium carbonate 200 mg  p.o. 1 to 2 times a day.  14. Vitamin C 1000 mg p.o. daily.  15. FiberChoice 1 tablet 1 to 2 times a day.  16. Nitrostat 0.4 mg sublingually every five minutes as needed for chest pain.    ADDITIONAL MEDICATIONS:  1. Aspirin 325 mg p.o. daily. 2. Risperdal 0.25 mg sublingually or p.o. twice daily at 9:00 a.m. as well as 5:00 p.m.  The patient is to stop Plavix.  DIET: The patient's diet will be 2-gram salt, low fat, low cholesterol, mechanical soft diet.   ACTIVITY LIMITATIONS: As tolerated.   REFERRAL: Home Health physical therapy 2 to 7 times a week.   FOLLOWUP: Follow-up appointment with  Dr. Dossie Arbour two days after discharge as well as Dr. Wyn Quaker 1 to 2 weeks after discharge.    CONSULTANTS:  1. Care management. 2. Dr. Malvin Johns, neurologist.   RADIOLOGIC STUDIES:  1. Chest PA and lateral 10/26/2011 showed moderately large hiatal hernia, probable chronic inflammatory changes. No effusion noted.  2. CT scan of head without contrast 10/26/2011 showed chronic and involutional changes without evidence of acute abnormalities.  3. MRI of the brain 10/28/2011 without contrast showed evolving left frontal infarct possibly with some associated hemorrhage. Follow-up noncontrast CT is recommended. There is no significant edema or midline shift. Changes of mild atrophy are present. Right maxillary sinus disease is present as noted. 4. Repeat CT of the head without contrast 10/28/2011 revealed abnormal area of left frontal region on MRI does not show evidence of hemorrhage by CT. Changes of atrophy with chronic small vessel ischemic disease and evolving left frontal infarct noted.  HISTORY OF PRESENT ILLNESS:  The patient is a Morgan Jenkins with past medical history significant for history of stroke in the past who presented to the hospital with the patient's family's complaints of altered mental status and confusion for weeks, but worsening over the past few days prior to coming to the hospital. Please refer to Dr. Nicky Pugh admission note on 10/26/2011. Apparently the patient was more confused, more agitated, and was sundowning. She was complaining also of headaches. The patient had a fall and head trauma three weeks ago and was diagnosed with  intracranial hemorrhage and was treated with medications at [facility]. Since that time she has become more confused and arrived at the hospital for further evaluation. In the Emergency Room she was noted to be somewhat hypoxic with oxygen saturation of 88 and pO2 was 53. She was admitted for altered mental status evaluation. Her temperature was 97.6, blood  pressure 168/73, pulse was 85, and oxygen saturation was 93% on room air.  Physical exam was unremarkable.  The patient was confused and sometimes agitated.  LABORATORY, DIAGNOSTIC, AND RADIOLOGICAL DATA: 10/26/2011 glucose 131, otherwise BMP was unremarkable. The patient's bicarbonate level, CO2 level was elevated at 33. The patient's liver enzymes were normal. Cardiac enzymes were negative. CBC was within normal limits. However, her platelet count was somewhat low at 134.   Urinalysis showed yellow hazy urine, negative for glucose, bilirubin, or ketones. Specific gravity was 1.01, pH 8, negative for blood, protein, or nitrites, trace leukocyte esterase, one red blood cell, two white blood cells. No bacteria were seen. One epithelial cell was noted as well as less than one transitional epithelial cell. Mucous was present as well as amorphous crystals.   ABGs done in the Emergency Room showed pH of 7.46, pCO2 45, pO2 53. Saturation was 88.9%,. However, the patient's oxygen saturation was 96% on room air. EKG revealed sinus brady at 59 beats per minute, possible left atrial enlargement, right bundle branch block. Otherwise no acute ST-T changes were noted. Chest x-ray showed changes consistent with chronic inflammatory changes and no significant changes from prior.  CT of the head was unremarkable.   HOSPITAL COURSE:  The patient was admitted to the hospital for further evaluation. Consultation with Dr. Malvin Johns, neurologist, was obtained. Dr. Malvin Johns felt that the patient very likely has worsening mental status as the result of a number of problems such as possibly even hypertensive encephalopathy, also hypoxia and possibly even polypharmacy. According to Dr. Malvin Johns, stroke seemed unlikely given the patient's normal neuro exam. He recommended continuing blood pressure management closely. For hypoxia he recommended pulmonary consultation, and he recommended physical therapy evaluation to determine and correct  with  assistive walking devices. In regard to altered mental status, urine toxicology screen and TSH should be considered according to Dr. Malvin Johns. However, he felt that the patient's worsening altered mental status very likely was multifactorial, including poorly controlled hypertension as well as underlying dementia, and polypharmacy. Given the moderate atrophy seen on head CT as well as on exam findings, the patient very likely has moderate underlying dementia, which was easily exacerbated by any normally inconsequential factors such as hypoxia or poorly controlled hypertension or polypharmacy according to Dr. Malvin Johns. As the patient had a history of stroke with no significant physical findings, the decision was made to go on and proceed to MRI of the patient's brain. MRI revealed evolving frontal infarct. It was felt that altered mental status could have been related to evolving or acute stroke. After multiple discussions with family, the decision was made not to proceed with aggressive evaluation of one more stroke. The patient had a recent carotid ultrasound done in 03/2011.  At that time it revealed 50 to 75% stenosis in the proximal left internal carotid artery. Right internal carotid artery peak velocities suggested approximately 50% stenosis. However, by visual inspection there was less than 50% stenosis according to the radiologist. It was felt that the patient very likely had thrombotic stroke possibly originating from the patient's left internal carotid artery. However, the patient's family decided on natural death and  no significant intervention. We were considering referral to vascular surgery again; however, the patient's family declined this referral at this time. It is recommended to possibly get vascular surgery as outpatient if the patient's family changes their mind. Meanwhile the patient's family requested Plavix to be changed to aspirin because of risks of fall and concerns of hemorrhagic  events- hematomas as well as intracranial hemorrhages. I felt it was appropriate at this point and Plavix was changed to full dose of aspirin. The patient was, however, recommended to continue to therapy with Lipitor. She was also advised to start Risperdal for her dementia and sundowning as well as agitation symptoms.  The patient's Risperdal dose can be advanced to treat her agitation symptoms.  In regard to hypertension, the patient is to continue her metoprolol and lisinopril. The patient's blood pressure was better controlled later on. On the day of discharge, the patient's blood pressure is 106/65, oxygen saturation remained stable at 93% on room air. The patient was also evaluated by a physical therapist and oxygen saturation was checked by nocturnal oximetry study as well as on exertion on room air and the patient remained in good standing. Her oxygen saturation remained stable at around 93%. It was felt that hypoxia very likely did not have much influence? during this admission as the patient was not noted to be hypoxic on numerous studies and evaluations. No consultation by pulmonologist.  In regard to hyperlipidemia, the patient is to continue Lipitor. The patient is to follow up with her primary care physician, Dr. Dossie Arbourrissman, and reassess her lipid panel. However, she is on the highest doses of Lipitor and she is to follow up with her primary care physician to make decisions if any other medication needs to be added for her lipid management.  In regard to gastroesophageal reflux disease, the patient is to continue PPI.   For neuropathy she is to continue Cymbalta. For dementia she is to continue Aricept.  For chronic pain she is to continue tramadol as well as Mobic.  The patient is being discharged in stable condition with the above-mentioned medications and followup.   The patient's vital signs on the day of discharge: Temperature 97.5, pulse 63, respiration rate 18, blood pressure 106/65,  saturation 93% on room air at rest.  TIME SPENT:  40 minutes.    ____________________________ Katharina Caperima Yuleidy Rappleye, MD rv:bjt D: 10/29/2011 18:43:12 ET T: 10/30/2011 13:34:37 ET JOB#: [id number]  cc: Katharina Caperima Avraj Lindroth, MD, <Dictator> Steele SizerMark A. Crissman, MD Annice NeedyJason S. Dew, MD Tacy Chavis MD ELECTRONICALLY SIGNED 11/02/2011 14:47

## 2014-07-07 NOTE — Consult Note (Signed)
Referring Physician:  Demetrios Loll :   Primary Care Physician:  Demetrios Loll : [facility], [address], [place], [place] [address], [place] [id number]  Reason for Consult:  Admit Date: 26-Oct-2011   Chief Complaint: AMS   Reason for Consult: altered mental status   History of Present Illness:  History of Present Illness:   81 year old Caucasian woman with a history of hypertension, hyperlipidemia, depression, dementia, and GERD presented on 10/26/11 to [facility] for the report of worsening confusion over the past 3 weeks.  The caregivers state that the patient had a fall approximately 3 weeks ago and was taken to [facility].  She was found to have an intracranial hemorrhage and was given platelets.  It is unclear if the patient had worsening AMS that led to the fall or if the worsening AMS started directly after the fall.  In any case, the patient cannot remember why she fell.  She says she is a little "stumbly" when she walks and that may be why she fell, but ultimately it is unclear.  The caregiver believes that her general level of confusion has certain worsened over the past couple of days leading up to admission.   admission, the patient has been noted to have elevated blood pressure, low oxygen saturation levels, and is taking 17 medications.   denies chest pain, SOB, numbness, tingling, focal weakness, headache, vision changes, burning or itching.    ROS:   General denies complaints    HEENT no complaints    Lungs no complaints    Cardiac no complaints    GI no complaints    GU no complaints    Musculoskeletal no complaints    Extremities no complaints    Skin no complaints    Neuro no complaints    Endocrine no complaints    Review of Systems   Otherwise, her 10 point ROS is negative except as mentioned above.      Past Medical/Surgical Hx:  Hiatal Hernia:   Arthritis:   Scoliosis:   head injury:   hypertension:   Knee Surgery - Left:   bil. carpal tunnel  surgery:   back surgery:   partial hysterectomy:   Home Medications: Medication Instructions Last Modified Date/Time  Cymbalta 30 mg oral delayed release capsule 1 cap(s) orally once a day 08-Aug-13 11:26  metoprolol succinate 25 mg oral tablet, extended release 1 tab(s) orally once a day 08-Aug-13 11:26  lisinopril 20 mg oral tablet 1 tab(s) orally once a day 08-Aug-13 11:26  Vitamin D3 1000 intl units oral tablet 1 tab(s) orally once a day 08-Aug-13 11:26  Lasix 20 mg oral tablet 1 tab(s) orally once a day -Used PRN for weight gain of more than 2lbs 08-Aug-13 11:26  Plavix 75 mg oral tablet 1 tab(s) orally once a day 08-Aug-13 11:26  Colace 100 mg oral capsule 1 cap(s) orally 2 times a day, As Needed- for Constipation  08-Aug-13 11:26  Protonix 40 mg oral delayed release tablet 1 tab(s) orally once a day 08-Aug-13 11:26  Aricept 5 mg oral tablet 1 tab(s) orally once a day 08-Aug-13 11:26  Lipitor 80 mg oral tablet 1 tab(s) orally once a day 08-Aug-13 11:26  tramadol 50 mg oral tablet 1 tab(s) orally 1-4 times a day.  08-Aug-13 11:26  multivitamin 1 tab(s) orally once a day 08-Aug-13 11:26  Mobic 15 mg oral tablet 1 tab(s) orally once a day 08-Aug-13 11:26  calcium (as carbonate) 600 mg oral tablet 1 tab(s) orally 1  to 2 times a day 08-Aug-13 11:26  Vitamin C 1000 mg oral tablet 1 tab(s) orally once a day 08-Aug-13 11:26  Fiber Choice 1 tab(s) orally 1 to 2 times a day 08-Aug-13 11:26  Nitrostat 0.4 mg sublingual tablet 1 tab(s) sublingual every 5 minutes, As Needed- for Chest Pain  08-Aug-13 11:26   Allergies:  PCN: Rash  Cipro: Rash  Social/Family History:  Employment Status: unemployed   Social History: Denies smoking or alcohol.   Family History: Mother - seizure  Father - stroke   Vital Signs: **Vital Signs.:   10-Aug-13 08:36   Vital Signs Type Pre Medication   Temperature Temperature (F) 98   Celsius 36.6   Temperature Source oral   Pulse Pulse 61   Respirations  Respirations 18   Systolic BP Systolic BP 675   Diastolic BP (mmHg) Diastolic BP (mmHg) 74   Mean BP 101   Pulse Ox % Pulse Ox % 91   Pulse Ox Activity Level  At rest   Oxygen Delivery Room Air/ 21 %   Physical Exam:  General: NAD   HEENT: Vision is intact.   Neck: No lymphadenopathy.   Chest: Normal breath sounds.   Cardiac: S1 and S2 regular rate and rhythm. No murmurs or gallops.   Extremities: No edema, clubbing, or cyanosis.   Neurologic Exam:  Mental Status: Patient is alert and oriented x 2.  She correctly identifies Obama, 2013 and that the hospital is [facility]".  She mistakenly identifies December as the month and is not sure why she's in the hospital.   Cranial Nerves: CN2-12 are intact: There is normal conjugate gaze. Pupils are round, equal and reactive to light.  Visual fields are full to confrontation.  Facial sensation and muscle activation intact bilaterally.  Hearing intact bilaterally.  Palate elevates symmetrically.  Shoulder shrug and neck turn  full strength.  Tongue protrudes midline.   Motor Exam: UE and LE strength 4+/5 throughout bilaterally.  Muscle tone and bulk normal.  There is no pronator drift.   Deep Tendon Reflexes: DTRs are 1+ bilaterally and equal.   Sensory Exam: UE and LE sensation is intact and equal bilaterally to all modalities.   Coordination: Grossly intact.   Cerebellar Exam: Finger-to-nose is within normal limits.   Gait: Walks slowly and with assistance.  Normally uses a 4 point cane.   Lab Results: Hepatic:  08-Aug-13 10:45    Bilirubin, Total 0.4   Alkaline Phosphatase 63   SGPT (ALT) 16   SGOT (AST) 16   Total Protein, Serum 6.5   Albumin, Serum 3.5  Routine Chem:  08-Aug-13 10:45    Glucose, Serum  131   BUN 12   Creatinine (comp) 0.80   Sodium, Serum 143   Potassium, Serum 3.9   Chloride, Serum 105   CO2, Serum  33   Calcium (Total), Serum 9.4   Osmolality (calc) 287   eGFR (African American) >60   eGFR (Non-African  American) >60 (eGFR values <45m/min/1.73 m2 may be an indication of chronic kidney disease (CKD). Calculated eGFR is useful in patients with stable renal function. The eGFR calculation will not be reliable in acutely ill patients when serum creatinine is changing rapidly. It is not useful in  patients on dialysis. The eGFR calculation may not be applicable to patients at the low and high extremes of body sizes, pregnant women, and vegetarians.)   Anion Gap  5  Cardiac:  08-Aug-13 10:45    Troponin I <  0.02 (0.00-0.05 0.05 ng/mL or less: NEGATIVE  Repeat testing in 3-6 hrs  if clinically indicated. >0.05 ng/mL: POTENTIAL  MYOCARDIAL INJURY. Repeat  testing in 3-6 hrs if  clinically indicated. NOTE: An increase or decrease  of 30% or more on serial  testing suggests a  clinically important change)   CK, Total 61   CPK-MB, Serum 1.6 (Result(s) reported on 26 Oct 2011 at 11:29AM.)  Routine UA:  08-Aug-13 12:05    Color (UA) Yellow   Clarity (UA) Hazy   Glucose (UA) Negative   Bilirubin (UA) Negative   Ketones (UA) Negative   Specific Gravity (UA) 1.010   Blood (UA) Negative   pH (UA) 8.0   Protein (UA) Negative   Nitrite (UA) Negative   Leukocyte Esterase (UA) Trace (Result(s) reported on 26 Oct 2011 at 01:00PM.)   RBC (UA) 1 /HPF   WBC (UA) 2 /HPF   Bacteria (UA) NONE SEEN   Epithelial Cells (UA) 1 /HPF   Transitional Epithelial (UA) <1 /HPF   Mucous (UA) PRESENT   Amorphous Crystal (UA) PRESENT (Result(s) reported on 26 Oct 2011 at 01:00PM.)  Routine Hem:  09-Aug-13 04:59    WBC (CBC) 5.2   RBC (CBC) 4.01   Hemoglobin (CBC) 12.0   Hematocrit (CBC) 35.4   Platelet Count (CBC)  130   MCV 88   MCH 30.0   MCHC 33.9   RDW 14.1   Neutrophil % 57.8   Lymphocyte % 28.5   Monocyte % 9.6   Eosinophil % 3.8   Basophil % 0.3   Neutrophil # 3.0   Lymphocyte # 1.5   Monocyte # 0.5   Eosinophil # 0.2   Basophil # 0.0 (Result(s) reported on 27 Oct 2011 at [facility].)    Radiology Results: CT:    08-Aug-13 12:02, CT Head Without Contrast   CT Head Without Contrast    REASON FOR EXAM:    fall  COMMENTS:       PROCEDURE: CT  - CT HEAD WITHOUT CONTRAST  - Oct 26 2011 12:02PM     RESULT: Head CT dated 10/26/2011 comparison made to prior study dated   10/04/2011.    Findings: There is no evidence of intra-axial nor extra-axial fluid   collections nor evidence of acute hemorrhage. Diffuse areas of   low-attenuation project within the subcortical, deep, and periventricular   white matter regions. There is a component of hydrocephalus ex vacuo as   well as a cavum septum pellucida. There is no evidence of a depressed   skull fracture. Visualized paranasal sinuses and mastoid air cells are   patent. The previously described multiple punctate areas of acute     hemorrhage have resolved in the interim.    IMPRESSION:  Chronic and involutional changes without evidence of acute   abnormalities.          Verified By: Mikki Santee, M.D., MD   Radiology Impression:  Radiology Impression: Radiology has read chronic changes only, no acute changes, interim resolution of previously described areas of punctate bleeding.  In addition, my read shows moderate global cerebral atrophy in particularly in both temporal lobes.   Impression/Recommendations:  Recommendations:   81 year old Caucasian woman with a history of hypertension, hyperlipidemia, depression, dementia, and GERD presented on 10/26/11 to [facility] for the report of worsening confusion over the past 3 weeks.  The caregivers stated that the patient had a fall approximately 3 weeks ago and the caregiver believes that her general  level of confusion has worsened over the past couple of days leading up to this admission.  Overall, it is reassuring that her neurologic exam does not show any focal deficits and HCT is unremarkable.  During this hospitalization, the patient has been noted to have elevated blood  pressure (into systolic 340B), low oxygen saturation levels, and is taking 17 medications.  I think it is possible that her worsening mental status could be the result of one or a number of these issues, such as hypertensive encephalopathy, PRES, hypoxia, and/or polypharmacy.  Stroke seems unlikely given the normal neuro exam.  She has ungone a detailed workup so far including electrolytes, UA, CXR, HCT.  She has been afebrile, WBC is normal, and troponins are negative. such, at this point, I would recommend the following: continue to closely control BP pulm consult.  She says she was previously on O2 although I don't know if that's accurate or not.    Fallsoutpatient evaluation with physical therapy to determine the correct assistive walking device.  She does not regularly use her four point lift and place walker and should have it fitted/evaluated, she may need a rolling walker. and TSH could be considered.  As above, I think her worsening AMS is likely multifactorial including poorly controlled HTN, hypoxia, underlying dementia, and polypharmacy.  Given the moderate atrophy seen in her HCT and her exam findings, it is likely that her moderate underlying dementia is easily exacerbated by any normally inconsequential factor such as a virus, hypoxia, poorly controlled HTN or polypharmacy.  you for this interesting consult.    Electronic Signatures: Anabel Bene (MD)  (Signed 10-Aug-13 10:09)  Authored: REFERRING PHYSICIAN, Primary Care Physician, Consult, History of Present Illness, Review of Systems, PAST MEDICAL/SURGICAL HISTORY, HOME MEDICATIONS, ALLERGIES, Social/Family History, NURSING VITAL SIGNS, Physical Exam-, LAB RESULTS, RADIOLOGY RESULTS, Recommendations   Last Updated: 10-Aug-13 10:09 by Anabel Bene (MD)

## 2014-07-07 NOTE — Consult Note (Signed)
Consult dictated.  81 YOWF came with chest pain, but denies chest pain at this time.  First set of cardiac enzymes unremarkable along with EKG.  She appears to be comfortable and wants to go home.  It would be reasonable to send her home with [facility]  Continue same medications.  Our office number is [phone] and she can come to the office on Tuesday at 3pm.  Thank you for the referral.    Electronic Signatures: Radene KneeKhan, Thao Vanover Ali (MD)  (Signed on 28-Sep-13 09:50)  Authored  Last Updated: 28-Sep-13 09:50 by Radene KneeKhan, Chana Lindstrom Ali (MD)

## 2014-07-07 NOTE — Discharge Summary (Signed)
PATIENT NAME:  Morgan Jenkins, Morgan Jenkins MR#:  [id number] DATE OF BIRTH:  1929-09-26  DATE OF ADMISSION:  12/15/2011 DATE OF DISCHARGE:  12/16/2011  DISCHARGE DIAGNOSES:  1. Chest pain, likely noncardiac, with negative serial cardiac enzymes and cardiology in agreement.  2. Possible urinary tract infection based on urinalysis. Patient is asymptomatic. Will treat for two more days with antibiotic.  3. Alzheimer's dementia. Outpatient neurological follow up if needed.    SECONDARY DIAGNOSES:  1. History of cerebrovascular accident.  2. History of Alzheimer's dementia.  3. Hypertension.  4. Hyperlipidemia.  5. Gastroesophageal reflux disease.  6. Depression.   CONSULTATION: Cardiology, Dr. Adrian Blackwater.   LABORATORY, DIAGNOSTIC AND RADIOLOGICAL DATA: Chest x-ray on 09/27 showed no acute cardiopulmonary disease.   CT scan of the head without contrast on 09/27 showed no acute abnormalities.   Urinalysis on admission showed 33 epithelial cells, trace bacteria, 48 WBCs, 3+ leukocyte esterase, and WBC in clumps.   Urine culture was contaminated.   HISTORY AND SHORT HOSPITAL COURSE: Patient is a 81 year old female with above-mentioned medical problems was admitted for altered mental status. This was thought to be secondary to urinary tract infection. She was also found to have chest pain and was ruled out with three negative sets of cardiac enzymes. Cardiology consultation was obtained with Dr. Adrian Blackwater who recommended outpatient follow up with him in the office for possible Myoview. Patient did not have any more chest pain in the hospital and her mental status was close to her baseline.   She was discharged home in stable condition on 09/28.   PHYSICAL EXAMINATION: VITAL SIGNS: On the date of discharge her vital signs are as follows: Temperature 98.1, heart rate 58 per minute, respirations 18 per minute, blood pressure 121/62 mmHg. She was saturating 92% on room air. Pertinent Physical Examination  on the date of discharge: CARDIOVASCULAR: S1, S2 normal. No murmurs, rubs or gallop. LUNGS: Clear to auscultation bilaterally. No wheezing, rales, rhonchi, crepitation. ABDOMEN: Soft, benign. NEUROLOGIC: Nonfocal examination. She does have baseline dementia and at baseline she is always confused as per daughter. All other physical examination remained at the baseline.   DISCHARGE MEDICATIONS:  1. Cymbalta 30 mg p.o. daily.  2. Metoprolol 25 mg p.o. daily.  3. Lisinopril 20 mg p.o. daily.  4. Vitamin D3 1000 international units once daily.  5. Lasix 20 mg p.o. daily and as needed for weight gain more than 2 pounds.  6. Colace 100 mg p.o. b.i.d. as needed.  7. Protonix 40 mg p.o. daily.  8. Aricept 5 mg p.o. daily.  9. Lipitor 80 mg p.o. daily.  10. Tramadol 50 mg p.o. 1 to 4 times a day.  11. Multivitamin once daily.  12. Mobic 15 mg p.o. daily.  13. Calcium 600 mg 1 to 2 times a day.  14. Vitamin C 1000 mg p.o. daily.  15. FiberChoice 1 to 2 times a day.  16. Nitrostat 0.4 mg sublingual every five minutes as needed.  17. Aspirin 325 mg p.o. daily.  18. Risperdal 0.5 mg half tablet p.o. b.i.d.  19. Nitrofurantoin 1 capsule p.o. b.i.d. for two more days.   DISCHARGE DIET: Low sodium.   DISCHARGE ACTIVITY: As tolerated.   DISCHARGE INSTRUCTIONS AND FOLLOW UP: Patient was instructed to follow up with her primary care physician, Dr. Vonita Moss, in 1 to 2 weeks. She will need follow up with Dr. Adrian Blackwater on coming Tuesday, 10/01, at 3:00 p.m. for possible Myoview.   TOTAL TIME DISCHARGING  THIS PATIENT: 45 minutes.  ____________________________ Ellamae SiaVipul S. Sherryll BurgerShah, MD vss:cms D: 12/16/2011 21:18:43 ET T: 12/18/2011 09:33:43 ET JOB#: [id number]  cc: Yamil Oelke S. Sherryll BurgerShah, MD, <Dictator> Steele SizerMark A. Crissman, MD Laurier NancyShaukat A. Khan, MD Ellamae SiaVIPUL S [facility] MD ELECTRONICALLY SIGNED 12/19/2011 0:17

## 2014-07-07 NOTE — H&P (Signed)
PATIENT NAME:  Morgan Jenkins, BUZZELL MR#:  [id number] DATE OF BIRTH:  October 08, 1929  DATE OF ADMISSION:  10/26/2011  ER REFERRING PHYSICIAN: Olivia Mackie, MD    PRIMARY CARE PHYSICIAN: Vonita Moss, MD   CHIEF COMPLAINT: Confusion for weeks, worsening for a few days.   HISTORY OF PRESENT ILLNESS: The patient is a 81 year old Caucasian female with a history of hypertension, hyperlipidemia, depression, dementia, gastroesophageal reflux disease, who presented to the ED with confusion for weeks and worsening for the past few days. The patient is awake, alert and oriented x1, cannot provide any information. She denies any symptoms. According to her healthcare nurse, the patient is confused and combative, has a headache, but the patient has no other symptoms. The patient had a fall and had a head trauma three weeks ago diagnosed with intracranial hemorrhage and was treated with platelets at [facility].  Since that time, the patient had became confused. The patient was noted to have low saturation at 88 and PO2 was 53. She was admitted for altered mental status.   PAST MEDICAL HISTORY:  1. Hypertension. 2. Hyperlipidemia. 3. Depression. 4. Dementia. 5. Gastroesophageal reflux disease.  6. History of dyspepsia. 7. Neuropathy.   PAST SURGICAL HISTORY:  1. Cardiac catheterization.  2. Lumbar diskectomy.  3. Hysterectomy.  4. Carpal tunnel syndrome.  5. Breast lumpectomy.  6. Lumbar fusion. 7. Left total knee replacement.  SOCIAL HISTORY: No smoking or drinking or illicit drugs.   FAMILY HISTORY: Positive for diabetes and seizure in the mother and heart attack. Her father had a stroke.   ALLERGIES: Cipro, penicillin.  MEDICATIONS:  1. Vitamin D3 1000 international units p.o. daily. 2. Vitamin C 1000 mg p.o. daily.  3. Tramadol 50 mg p.o. q.i.d.  4. Protonix 40 mg p.o. daily.  5. Plavix 75 mg p.o. daily.  6. Nitrostat 0.4 mg sublingual every 5 minutes p.r.n. for chest pain.  7. Multivitamin 1 tablet  p.o. daily.  8. Mobic 15 mg p.o. daily.  9. Lopressor 25 mg p.o. once daily.  10. Lisinopril 20 mg p.o. daily.  11. Lipitor 80 mg p.o. daily.  12. Lasix 20 mg p.o. daily.  13. FiberChoice 1 tablet p.o. 1 to 2 times a day.  14. Cymbalta 30 mg p.o. daily.  15. Colace 100 mg p.o. b.i.d.  16. Calcium 1 tablet p.o. daily.  17. Aricept 5 mg p.o. daily.   REVIEW OF SYSTEMS: CONSTITUTIONAL: The patient is demented and denies any symptoms. No fever or chills. Has a headache and sometimes dizziness. EYES: No double vision or blurred vision. ENT: No hearing loss, epistaxis, slurred speech or dysphagia. No slurred speech. RESPIRATORY: No cough, sputum, shortness of breath, or hemoptysis. GASTROINTESTINAL: No abdominal pain, nausea, vomiting, or diarrhea. No melena or bloody stool. CARDIOVASCULAR: No chest pain, palpitation, orthopnea, nocturnal dyspnea. SKIN: No rash or jaundice. ENDOCRINE: No polyuria, polydipsia. HEMATOLOGY: No easy bruising or bleeding. NEUROLOGY: The patient has confusion but no seizure, loss of consciousness.   PHYSICAL EXAMINATION:  VITAL SIGNS: Temperature 97.6, blood pressure 168/73, pulse 85, oxygen saturation 93% on room air.  GENERAL: The patient is  awake, alert and oriented  x2, in no acute distress.   HEENT: Pupils are round, equal, reactive to light and accommodation. Moist oral mucosa. Clear oropharynx.   NECK: Supple. No JVD or carotid bruits. No lymphadenopathy. No thyromegaly.   CARDIOVASCULAR: S1 and S2 regular rate and rhythm. No murmurs or gallops.   PULMONARY: Bilateral air entry. No wheezing or rales. No use  of accessory muscles to breathe.   ABDOMEN: Soft. No distention. No tenderness. No organomegaly. Bowel sounds present.   EXTREMITIES: No edema, clubbing, or cyanosis. No calf tenderness. Strong bilateral pedal pulses.   SKIN: No rash or jaundice.   NEUROLOGIC: The patient is alert and oriented x2, confused, sometimes agitated .  No focal deficit.  Sensation intact.   LABORATORY, DIAGNOSTIC AND RADIOLOGICAL DATA: Urinalysis negative. CAT scan of head: Chronic changes without acute abnormality. ABG showed pH 7.46, pCO2 45, pO2 53. WBC 52, hemoglobin 12.9, platelets 134. Glucose 135, BUN 12, creatinine 0.8. Electrolytes are normal. Troponin less than 0.02. CK 61.0. CK-MB 1.6. EKG shows sinus bradycardia at 59 beats per minute   IMPRESSION:  1. Altered mental status.  2. Hypoxia, unknown etiology. The patient had similar symptoms before and follows with Dr. Meredeth IdeFleming as outpatient.  3. Dementia.  4. Hypertension, uncontrolled.  5. Thrombocytopenia. 6. Hyperlipidemia.  7. Gastroesophageal reflux disease.  8. History of neuropathy.   PLAN OF TREATMENT:  1. The patient will be admitted to a Medical floor. We will give O2 by nasal cannula and nebulizer p.r.n. We will get a Pulmonary consult for hypoxia.  2. For altered mental status, we will monitor. We will get neuro checks, continue Aricept, Haldol p.r.n. and hold NSAIDs and tramadol.  3. For uncontrolled hypertension, we will continue lisinopril, Lasix, and Lopressor.  4. Gastrointestinal and deep vein thrombosis prophylaxis.   I discussed the patient's situation with the patient's healthcare nurse.   TIME SPENT:   About 65 minutes.   ____________________________ Shaune PollackQing Mendell Bontempo, MD qc:cbb D: 10/26/2011 17:09:08 ET T: 10/26/2011 18:45:11 ET JOB#: [id number]  cc: Shaune PollackQing Lynnox Girten, MD, <Dictator> Steele SizerMark A. Crissman, MD Shaune PollackQING Janelis Stelzer MD ELECTRONICALLY SIGNED 10/27/2011 16:45

## 2014-07-07 NOTE — Consult Note (Signed)
PATIENT NAME:  Morgan Jenkins, Morgan Jenkins MR#:  [id number] DATE OF BIRTH:  [birth year]  DATE OF CONSULTATION:  12/16/2011  REFERRING PHYSICIAN:   CONSULTING PHYSICIAN:  Dionisio David, MD  HISTORY OF PRESENT ILLNESS: This is a 81 year old white female with a past medical history of Alzheimer's dementia who was recently discharged in August 2013 from [facility] who came in with an episode of chest pain. Apparently she was complaining of 7/10 chest pain when she first was brought in but in the Emergency Room and right now she is denying any chest pain. She says she actually had chest pain one week ago. She seems to be very comfortable and wants to go home. Denies any shortness of breath or any other symptoms.   PAST MEDICAL HISTORY:  1. She had carotid Doppler which had moderate bilateral carotid disease 50 to 75% on the left side and 50% on the right side. 2. Diagnosed in the past with Alzheimer's. 3. Hypertension.  4. Hyperlipidemia.  5. GI reflux. 6. Depression.   SOCIAL HISTORY: She denies EtOH abuse or smoking.   FAMILY HISTORY: Positive for coronary artery disease and myocardial infarction   PHYSICAL EXAMINATION:   GENERAL: She appears to be alert and oriented actually x3.  VITAL SIGNS: Temperature 97.7, pulse 58, respirations 19, blood pressure 139/97, saturation 95.   NECK: No JVD. Bilateral carotid bruit.   LUNGS: Clear.   HEART: Regular rate and rhythm. Normal S1, S2. 2/6 systolic murmur at the mitral area.   ABDOMEN: Soft, nontender. Positive bowel sounds.   EXTREMITIES: No pedal edema.   DIAGNOSTIC DATA: EKG shows normal sinus rhythm, 63 beats per minute, right bundle branch block, left atrial enlargement, no acute changes.  Cardiac enzymes appear to be normal. The first set troponin is normal. CBC and MET C appears to be normal. Only thing abnormal was patient had positive nitrites in the urine and is being treated for UTI. Also, had maxillary sinus sinusitis  and she is being treated for that with ceftriaxone.   ASSESSMENT AND PLAN: The patient was admitted for chest pain. First set of cardiac enzymes are negative. EKG appears to be unremarkable with normal sinus rhythm and right bundle branch block. She appears to be much more alert than the history was given initially when she first came in so I think it is reasonable to discharge her with follow-up in the office next week for possible Lexiscan Myoview. She may go home as far as cardiac point of view.   Thank you for the referral.   ____________________________ Dionisio David, MD sak:drc D: 12/16/2011 09:47:01 ET T: 12/16/2011 09:55:55 ET JOB#: [id number]  cc: Dionisio David, MD, <Dictator> Dionisio David MD ELECTRONICALLY SIGNED 12/25/2011 15:06

## 2014-07-07 NOTE — H&P (Signed)
PATIENT NAME:  Morgan Jenkins, Pantera P MR#:  [id number] DATE OF BIRTH:  1930/02/05  DATE OF ADMISSION:  12/15/2011  PRIMARY CARE PHYSICIAN: Dr Dossie Arbourrissman.  The patient is a 81 year old Caucasian female with history of dementia, history of admission in August 2013 for altered mental status. She was found to have left frontal cerebrovascular accident. She also had a history of carotid stenosis, left internal carotid artery more than the right internal carotid artery, also Alzheimer dementia as mentioned above who presented to the hospital with complaints of sleepiness. Apparently patient was sleeping yesterday all day long. She slept 16 hours. This morning, however, she woke up; and she was complaining of chest pains. She was given 2 nitroglycerin sublingually because pain was prolonged, and EMS was called to bring patient to the emergency room. When EMS arrived, the patient complained of chest pains 7 out of 10 by intensity. However, patient has no chest pains in the emergency room.  Patient was also noted to have increased frequency of urination, a very little amount of urine, however very frequent urination; and hospitalist service was contacted for admission.   PAST MEDICAL HISTORY: Significant for history of admission in August 2013 for altered mental status. The patient was found to have left frontal cerebrovascular accident, history of carotid stenosis, left internal carotid artery 50% to 75% stenosis in January 2013 Doppler ultrasound right ICA around 50% stenosis. Alzheimer's dementia with sundowning. Hypertension, hyperlipidemia, gastroesophageal reflux disease, depression, also history of dyspepsia as well as neuropathy, also history of large hiatal hernia.   PAST SURGICAL HISTORY: Cardiac catheterization in the past, lumbar diskectomy, hysterectomy, carpal tunnel syndrome.  SURGERIES: Breast lumpectomy, lumbar fusion, left total knee replacement.   SOCIAL HISTORY: No smoking, alcohol, or illicit drugs.    FAMILY HISTORY: Positive for diabetes as well as seizure in mother and heart attack. The patient's father had a stroke.   ALLERGIES: Cipro as well as penicillin.   DISCHARGE MEDICATIONS: The patient is on: 1. Aricept 5 mg p.o. once daily. 2. Aspirin 325 mg p.o. daily. 3. Calcium 600 mg p.o. twice daily. 4. Colace 100 mg twice daily.  5. Cymbalta 30 mg p.o. daily. 6. FiberChoice 1 tablet 1 to twice daily.  7. Lasix 20 mg p.o. daily.  8. Lipitor 80 mg p.o. daily.  9. Lisinopril 10 mg p.o. daily.  10. Metoprolol succinate 25 mg p.o. daily.  11. Mobic 15 mg p.o. daily.  12. Multivitamins 1 daily.  13. Nitrostat 0.4 mg sublingually every 5 minutes as needed.  14. Protonix 40 mg p.o. daily.  15. Risperdal 0.5 mg 1/2 tablet twice daily.  16. Tramadol 50 mg 1 to 4 times a day.  17. Vitamin C 1000 mg daily.  18. Vitamin D3 at 1000 units once daily.   REVIEW OF SYSTEMS: Negative for chest pains, negative for any other symptoms such as fevers, chills, fatigue, weakness, pains, weight loss or gain. EYES: In regards to eyes, denies blurry vision, double vision, or glaucoma, or cataracts. ENT: Denies tinnitus, allergies, epistaxis, sinus pain, dentures, difficulty swallowing. RESPIRATORY: Denies any cough, wheezes, asthma, or chronic obstructive pulmonary disease. CARDIOVASCULAR: No chest pains, orthopnea, edema, arrhythmias. GASTROINTESTINAL: Denies nausea, vomiting, diarrhea or constipation. GENITOURINARY: Patient's family admits of frequency of urination. INTEGUMENTARY: Denies any polydipsia, nocturia, thyroid problems, heat or cold intolerance, or thirst. HEMATOLOGY: Denies any anemia, bruising, or bleeding. SKIN: Denies any acne, rash, lesion, or moles. MUSCULOSKELETAL: Denies arthritis, cramps, or swelling. NEUROLOGIC: No numbness, epilepsy, or tremor. PSYCHIATRIC: Denies anxiety, insomnia.  PHYSICAL EXAMINATION:   VITAL SIGNS: On arrival to the hospital, patient's vitals: Temperature is 96,  pulse 86, respiratory rate was 18, blood pressure 154/65, saturation 97% on room air.   GENERAL: This is a well-developed, well-nourished Caucasian female in no significant distress, sitting on the stretcher.   HEENT: Pupils are equal, reactive to light, extraocular movements intact. No icterus or conjunctivitis. Has normal hearing. No pharyngeal erythema. Mucosa is moist.   NECK: No masses, supple, nontender. Thyroid not enlarged. No lymphadenopathy, no JVD or carotid bruits. Full range of motion.   LUNGS: Clear to auscultation in all fields anteriorly. No rales, rhonchi, diminished breath sounds or wheezing. No labored inspirations, increased effort, dullness to percussion, or overt respiratory distress.   CARDIOVASCULAR: S1, S2 appreciated. No murmurs, gallops, or rub noted. PMI not lateralized. Chest is nontender to palpation. Palpable pedal pulses. No lower extremity edema, calf tenderness, or cyanosis.   ABDOMEN: Soft, nontender. Bowel sounds are present. No hepatosplenomegaly or masses were noted.   RECTAL: Deferred.   MUSCULOSKELETAL: Muscle strength able to move all extremities. No cyanosis, degenerative joint disease, or kyphosis. Gait is not tested.   SKIN: Skin did not reveal any rashes, lesions, erythema, nodularity, induration. It was warm and dry to palpation.   LYMPH: No adenopathy in the cervical region.   NEUROLOGICAL: Cranial nerves grossly intact. Sensory difficult to obtain. This patient is poorly cooperative,  no dysarthria.  The patient is alert, disoriented, poorly cooperative. Memory is severely impaired.  LABORATORIES: BMP shows a glucose 104, beta natriuretic peptide was 622, otherwise unremarkable study. Liver enzymes are normal. Cardiac enzymes, first set negative. CBC within normal limits with white blood cell count 6.2, hemoglobin 12.8, platelet count 161,000. Urinalysis: Amber cloudy urine, negative for glucose, bilirubin, or ketones, specific gravity 1.024, pH  7.0, negative for blood, protein, nitrites, 3+ leukocytes esterase, 17 red blood cells, 48 white blood cells, trace bacteria was noted as well as 33 epithelial cells as well as white blood cell clumps and mucous was also present. ABGs were done on room air and showed pH of 7.42, pCO2 of 50, pO2 of 64, saturation 92.5%.   RADIOLOGIC STUDIES:  Chest x-ray PA and lateral on December 15, 2011 showed no acute disease of chest. Cardiomegaly is reported. CT scan of head without contrast revealed involutional as well as chronic changes without evidence of acute abnormalities. Findings which may represent sinus disease in right maxillary sinus according to radiologist.   ASSESSMENT AND PLAN:  1. Altered mental status, very likely metabolic encephalopathy due to urinary tract infection.  2. Urinary tract infection. Continue patient on Rocephin. Urine cultures already taken.  3. Chest pain. Admit to medical floor. Continue her on metoprolol, aspirin as well as nitroglycerin topically, and heparin subcutaneous. Get cardiac enzymes x3. Get cardiologist involved for recommendations.  4. Hypertension. Continue metoprolol as well as nitroglycerin topically as well as her usual outpatient medications.  5. History of hyperlipidemia: Continue outpatient medications.   TIME SPENT: 50 minutes.   ____________________________ Katharina Caper, MD rv:vtd D: 12/15/2011 18:26:46 ET T: 12/16/2011 07:14:48 ET JOB#: [id number]  cc: Katharina Caper, MD, <Dictator> Steele Sizer, MD Romuald Mccaslin Winona Legato MD ELECTRONICALLY SIGNED 12/25/2011 13:40

## 2014-07-12 NOTE — Discharge Summary (Signed)
PATIENT NAME:  Morgan Jenkins, Morgan Jenkins MR#:  [id number] DATE OF BIRTH:  08-30-1929  DATE OF ADMISSION:  03/30/2011 DATE OF DISCHARGE:  04/03/2011  ADMITTING DIAGNOSIS: Shortness of breath and confusion.   DISCHARGE DIAGNOSES:  1. Dyspnea, hypoxia of unclear etiology. Suspected chronic obstructive pulmonary disease. No pulmonary embolism on computerized tomography scan. Atelectasis versus bacterial pneumonia on computerized tomography.  2. History of pulmonary hypertension.  3. Mild mitral stenosis, mitral regurgitation. Also, mild to moderate tricuspid regurgitation. Right ventricular systolic pressure is elevated to 40-50 mmHg on echocardiogram done in December 2012.  4. Arrhythmia with paroxysmal atrial tachycardia as well as idioventricular rhythm, each of them a few seconds only, short run, resolved, asymptomatic.  5. Cerebrovascular accident, acute, with punctate infarct in left cerebellar hemisphere, embolic, likely.  6. Carotid stenosis, left anterior carotid artery 50% to 75% stenotic, right anterior carotid artery, about 50% stenosis per Doppler ultrasound in January 2013. 7. History of hypertension. 8. Dementia.  9. Hyperlipidemia.  10. Depression. 11. Gastroesophageal reflux disease. 12. Dyspepsia. 13. Neuropathy.   DISCHARGE CONDITION: Fair.   DISCHARGE MEDICATIONS: The patient is to resume her outpatient medications which are:  1. Tramadol 50 mg p.o. 4 times daily as needed.  2. Aricept 5 mg p.o. at bedtime.  3. Cymbalta 30 mg p.o. daily.  4. Lyrica 50 mg p.o. twice daily.  5. Metoprolol succinate 25 mg p.o. daily.  6. Lisinopril 20 mg p.o. daily.  7. Nitrostat sublingually once as needed.  8. Macrobid 100 mg p.o. twice daily.  9. Vitamin D3 1,000 units once daily.  10. Lasix 20 mg p.o. daily as needed for weight gain more than 2 pounds. 11. Flector patch 1.3% topical film extended-release one patch twice daily to lower back.   ADDITIONAL MEDICATIONS:  1. Lipitor 80 mg p.o.  at bedtime, new medication. 2. Plavix 75 mg p.o. daily, new medication.  3. Colace 100 mg p.o. twice daily as needed.  4. Milk of magnesia 30 mL twice daily as needed.  5. Protonix 40 mg p.o. daily. This is a new medication.  6. Levaquin 500 mg p.o. daily for three more days to complete course.  7. Prednisone 40 mg p.o. once on 04/04/2011, but then taper x10 mg daily until stopped.   HOME OXYGEN: Portable tank at 2 liters of oxygen through nasal cannula on exertion as well as at bedtime.   DIET: 2 gram salt, mechanical soft, low fat, low cholesterol.   PHYSICAL ACTIVITY LIMITATIONS: As tolerated.  REFERRALS: Physical therapy 2 to 7 times a week.   FOLLOW-UP:  1. Follow-up appointment with Dr. Dossie Arbour in two days after discharge.  2. Follow-up with Dr. Gwen Pounds in two days after.  3. Follow-up with Dr. Meredeth Ide in one week after discharge.  4. Follow-up with Dr. Wyn Quaker in 4 to 6 weeks after discharge.   CONSULTANTS:  1. Care management. 2. Dr. Festus Barren. 3. Dr. Arnoldo Hooker.  4. Dr. Ned Clines.   RADIOLOGIC STUDIES: Chest PA and lateral 04/02/2011: Large hiatal hernia, left lung base infiltrate or atelectasis with a trace left pleural effusion. CT of the head without contrast 03/30/2011 showed stable CT of the brain. No acute intracranial abnormality. CT of chest with contrast 03/30/2011 revealed large hiatal hernia, bilateral lower lobe area of atelectasis, trace bilateral pleural effusions. No evidence of edema, pneumothorax or discrete mass. Thoracic aorta is normal in caliber, stable. Hypoattenuating area in left lobe of liver suggestive of a small hemangioma or cyst. MRI of brain without  contrast 03/31/2011: Punctate acute infarct of left cerebellar hemisphere. Carotid ultrasound 03/31/2011: Findings which suggest 50% to 75% stenosis of proximal left internal carotid artery. Further evaluation could be provided with CTA of neck as indicated. Peak systolic velocities in the right  internal carotid artery suggest approximately 50% stenosis. However, by visual inspection, there is less than 50% stenosis and the internal carotid artery to CCA ratio is within normal limits according to the radiologist. Echocardiogram was not done as it was done on 02/21/2011. It revealed left ventricle grossly normal size. There is no thrombus. Left ventricular systolic function is normal. Ejection fraction is equal or more than 55%. There is normal left ventricular wall thickness. Left ventricular wall motion is normal. Right ventricular systolic function is normal. The mitral valve leaflets appear thickened, but opened well. Calcified mitral apparatus causing mitral stenosis. There is mild mitral stenosis. There is mild mitral regurgitation. There is mild to moderate tricuspid regurgitation. Right ventricular systolic pressure is elevated at 40 to 50 mm Hg.   HISTORY: The patient is a 81 year old Caucasian female with past medical history significant for history of hypertension, hyperlipidemia, as well as dementia who presented to the hospital with complaints of not feeling well on 03/30/2011 as well as being short of breath. Apparently, the patient had started having symptoms approximately three days ago. She felt weak and had some chest discomfort. EMS brought her to [facility] where she underwent cardiac catheterization, which was unremarkable and the patient was sent back home. However, she continued to have weakness and on the day of admission on 03/30/2011, she was noted to have some shaking tremor episodes which were concerning for possible seizure and the patient was brought to the Emergency Room for further evaluation. In the Emergency Room, her temperature was 99.9, pulse 76, respiration rate 20, blood pressure 106/55, saturation was 93% on oxygen. Physical examination was unremarkable.   LABORATORY DATA: 03/30/2011 showed glucose 114, otherwise unremarkable BMP. The patient's liver enzymes were  unremarkable except albumin level of 3.1. Cardiac enzymes, first set, was negative. TSH was normal at 1.88. White blood cell count was normal at 8.0. Hemoglobin was low at 11.0, platelets 201. Blood cultures x2 did not show any growth. Urinalysis revealed amber hazy urine, negative for glucose or bilirubin, trace ketones, specific gravity 1.011, pH was 6.0, negative for blood, protein or nitrites or leukocyte esterase, two red blood cells, one white blood cell was seen. Urine cultures did not show any growth.   HOSPITAL COURSE: The patient was admitted to the hospital to telemetry.  1. In regards to the patient's dyspnea, the patient was noted to be dyspneic and was complaining of some shortness of breath. She was also noted to be somewhat hypoxic especially on exertion. Because of this hypoxia and concerns of chronic obstructive pulmonary disease as well as possible pneumonia which was found to be on CT scan, consultation with Dr. Meredeth Ide was obtained. Dr. Meredeth Ide saw the patient in consultation on 03/31/2011. He felt that the patient has bouts of hypoxia. Differential diagnosis includes chronic obstructive pulmonary disease as well as pulmonary hypertension, atelectasis, as well as pneumonia. He recommended to continue current therapy. However, he recommended to see him in pulmonary clinic and get PFTs and questionable right heart catheterization in the future. The patient was treated with antibiotic therapy while in the hospital for presumed pneumonia. Her sputum cultures, however, were not obtained. The patient is to continue antibiotic therapy as well as steroid taper for the next few days  to complete course. On the day of discharge, the patient's vital signs were stable. Her temperature was 98.4, pulse 84, respirations 18, blood pressure ranging from 130s to 170s, oxygen saturation 95% on room air at rest, going down as low as 90 on room air at rest. The patient will be evaluated by respiratory therapist  and she will be exerted depending on her oxygen saturations, she will likely be discharged on oxygen at home on exertion. She would need to use oxygen as well at nighttime. 2. Because of pulmonary hypertension, as well as history of mild mitral stenosis as well as moderate regurge and pulmonary hypertension, as mentioned above, consultation with Dr. Gwen Pounds was obtained. Dr. Gwen Pounds felt that the patient has normal left ventricular function, recommended to continue therapy for hypoxia. He recommended to continue therapy for hypoxia. He recommended to continue telemetry for rhythm concerns and after that, he is to see her as outpatient in the clinic. The patient was continued on telemetry. She was noted to have few episodes of very short run like 1 to 2 seconds lasting interventricular rhythm as well as one episode of PAT. The patient was continued on metoprolol. She was asymptomatic during those episodes. She is to follow up with primary care physician as well as Dr. Gwen Pounds as an outpatient for further recommendations and questionable right heart catheterization.  3. As the patient had tremor as well as confusion and questionable seizure, she had MRI of her brain which revealed a small cerebellar stroke. She was evaluated by physical therapist here at [facility] who felt that the patient would be best served to have rehabilitation in the facility where she will be going today on 04/03/2011. She also had an electroencephalogram done which was read by Dr. Sherryll Burger and it was unremarkable for any discharges. In fact, impression is normal record without electroencephalographic evidence of focality or epileptiform discharges, somewhat limited EEG due to significant muscle artifact according to Dr. Sherryll Burger. However, as the patient did not have any recurrent symptoms, it was felt that her tremor could have been just nonspecific related to episodes of hypoxia.  4. The patient was evaluated also for  possible aspiration problems. However, the speech therapist did not feel that she was having any issues with aspiration. Her diet was changed to a soft diet. However, she is to continue thin liquids.  5. As further evaluation of the patient's stroke, she underwent carotid ultrasound which revealed left internal carotid artery stenosis of 50% to 75% and right internal carotid artery of approximately 50% stenosis. The patient was evaluated by Dr. Festus Barren. However, Dr. Wyn Quaker felt that the patient would better be served to continue antiplatelet therapy as well as statin as medical management and follow-up with him in approximately 4 to 6 weeks in the office. This was discussed with the patient as well as family and they were in agreement. 6. Because the patient's cholesterol level was found to be elevated, the patient's laboratory data was checked in December 2012 revealing LDL of 126 as well as cholesterol of 197 and triglycerides of 68 and HDL of 57. Decision was made to change the patient's Pravachol to Lipitor. The patient is recommended to continue to have the patient's lipid panel checked. She is recommended to have her LDL levels lower than 100, preferably below 70 on this current therapy.  7. In regards to hypertension, the patient's blood pressure was well controlled. Also, satisfactorily controlled while she was in the hospital. The patient  is to continue her outpatient medications. She is not recommended to have low systolic blood pressure readings due to bilateral carotid stenosis and concerns of poor brain perfusion if her systolic blood pressure is lower than 130.  8. In regards to dementia, depression, gastroesophageal reflux disease, the patient is to continue outpatient medications. The patient's omeprazole was changed to Protonix due to initiation of new medication of Plavix and possible interaction of omeprazole with Plavix.  9. For hyperlipidemia, as mentioned above, the patient's Pravachol was  changed to Lipitor. It is recommended to follow the patient's LVL levels and keep them below 100, preferably below 70. 10. For neuropathy, the patient is to continue Lyrica as well as tramadol. The patient is being discharged in stable condition with the above-mentioned medications and follow-up.   VITAL SIGNS ON THE DAY OF DISCHARGE: Temperature 98.4, pulse 84, respiratory rate 18, blood pressure 150/86, saturation 95% on room air at rest.          TIME SPENT: 40 minutes.   ____________________________ Katharina Caperima Fendi Meinhardt, MD rv:ap D: 04/03/2011 12:57:28 ET            T: 04/03/2011 13:32:44 ET              JOB#: [id number]  cc: Katharina Caperima Thurley Francesconi, MD, <Dictator> Steele SizerMark A. Crissman, MD Lamar BlinksBruce J. Kowalski, MD Herbon E. Meredeth IdeFleming, MD Annice NeedyJason S. Dew, MD  Milas Schappell Winona LegatoVAICKUTE MD ELECTRONICALLY SIGNED 04/30/2011 04:5420:07

## 2014-07-12 NOTE — Consult Note (Signed)
PATIENT NAME:  Morgan Jenkins, Stevana P MR#:  [id number] DATE OF BIRTH:  05/06/1929  DATE OF CONSULTATION:  04/01/2011  REFERRING PHYSICIAN:  Katharina Caperima Vaickute, MD CONSULTING PHYSICIAN:  Annice NeedyJason S. Dew, MD  REASON FOR CONSULTATION: Carotid stenosis.   HISTORY OF PRESENT ILLNESS: This is a 81 year old white female who was admitted two days prior with shortness of breath and she was found to have some pneumonia. She also had some confusion and ataxia and a posterior circulation stroke was identified on MRI. This prompted a carotid duplex. The findings on this duplex were of 50 to 75% stenosis in the left internal carotid artery and approximately 50% stenosis on the right internal carotid artery. The vertebral arteries were patent and antegrade bilaterally. Although this is unlikely the cause of her posterior circulation stroke, her carotid stenosis will need ongoing follow-up and evaluation. We are consulted for this reason. She has not had arm or leg weakness or numbness that was clear and unilateral. She has not had speech difficulties.  PAST MEDICAL HISTORY:  1. Hypertension. 2. Hyperlipidemia.  3. Dementia.  4. Neuropathy.  5. Gastroesophageal reflux disease.   PAST SURGICAL HISTORY:  1. Left total knee in 2008.  2. Back surgery in 2005.  3. Breast lumpectomy.  4. Carpal tunnel release.  5. Heart catheterizations.  SOCIAL HISTORY: No alcohol or tobacco use.   FAMILY HISTORY: Positive for diabetes in multiple family members. Father had a stroke. Mother had epilepsy and myocardial infarction.   ALLERGIES: Cipro and penicillin.   HOME MEDICATIONS:  1. Vitamin D 1000 units daily.  2. Tramadol 50 mg four times daily. 3. Pravachol 40 mg twice a day. 4. Omeprazole 20 mg twice a day. 5. Metoprolol 25 mg daily.  6. Macrobid 100 mg twice a day. 7. Lyrica 50 mg twice a day. 8. Lisinopril 20 mg daily.  9. Lasix 20 mg daily.  10. Flector patch two times a week.  11. Cymbalta 30 mg daily.   12. Aspirin 81 mg daily.  13. Aricept 5 mg daily.  REVIEW OF SYSTEMS: Review of systems is a bit limited given the patient's mental status, but she is able to answer some questions and the daughter provides a significant amount of the history. GENERAL: There is apparently no fevers or chills. No intentional weight loss or gain. EYES: No blurred or double vision. EARS: No tinnitus or ear pain. CARDIOVASCULAR: No chest pain or palpitations. RESPIRATORY: She was short of breath on admission. This seems to be improved. GI: No nausea, vomiting, or diarrhea. GENITOURINARY: No dysuria or hematuria. ENDOCRINE: No heat or cold intolerance. NEUROLOGIC: Positive for posterior stroke. ENDOCRINE: No heat or cold intolerance. PSYCH: No anxiety or depression.  HEME: No anemia or easy bruising   PHYSICAL EXAMINATION:   GENERAL: This is an elderly white female lying comfortably in bed, not in apparent distress.   VITAL SIGNS: Temperature 98.3, pulse 93, blood pressure 117/60, and saturations are 92% on room air.   HEAD: Normocephalic and atraumatic.   EYES: Sclerae anicteric. Conjunctivae are clear.   NECK: Supple without adenopathy or jugular venous distention. Carotids have good upstroke. There is a soft left carotid bruit.   HEART: Somewhat irregular with systolic murmur.   LUNGS: Rhonchi in the bases bilaterally.   ABDOMEN: Soft, nondistended, and nontender.   EXTREMITIES: Mild lower extremity edema. Strength appears equal in all four extremities without obvious focal deficits.   SKIN: Warm and dry pulses.   PULSES: 1+ dorsalis pedis pulses bilaterally. Right  posterior tibial pulse is 2+ and left posterior pulse is 1+. Radial pulses are 2+ bilaterally.   LABORATORY DATA: Sodium 139, potassium 3.7, chloride 100, CO2 30, BUN 10, creatinine 0.75, and glucose 144. White blood cell count 7.5, hemoglobin 9.6, and platelet count 184,000.   ASSESSMENT AND PLAN: This is a 81 year old white female with  some carotid disease. I do not think this is likely the cause for her posterior circulation stroke. She has a degree of stenosis that would be subcritical and generally below our threshold for repair, and I would recommend ongoing outpatient evaluation. I will arrange to see her in the office in a month to six weeks and we will plan twice yearly duplex follow-ups for her moderate stenosis. I have discussed the small continued risk of stroke with stenosis below 75%. I have discussed the need for continued medical management. She is already on aspirin and Pravachol, which would be appropriate medical management. No further in-hospital recommendations are present.  This is a level-4 consultation. ____________________________ Annice Needy, MD jsd:slb D: 04/02/2011 11:23:00 ET     T: 04/02/2011 11:57:22 ET     JOB#: [id number] cc: Annice Needy, MD, <Dictator> Annice Needy MD ELECTRONICALLY SIGNED 04/22/2011 15:46

## 2014-07-12 NOTE — Consult Note (Signed)
Brief Consult Note: Diagnosis: elderly female with hypoxia and episode of syncope with pulmonary htn and no evidence of cad by cath and no chf by echo but cereballr stroke which at present may be most probable cause.   Patient was seen by consultant.   Consult note dictated.   Discussed with Attending MD.   Comments: further investigation of stroke as cuase with neuro consult consider watching tele for rhythm changes over night oxymetry may help.  Electronic Signatures: Lamar BlinksKowalski, Kaoir Loree J (MD)  (Signed 11-Jan-13 22:18)  Authored: Brief Consult Note   Last Updated: 11-Jan-13 22:18 by Lamar BlinksKowalski, Camrin Lapre J (MD)

## 2014-07-12 NOTE — Consult Note (Signed)
PATIENT NAME:  Morgan Jenkins, Morgan Jenkins MR#:  [id number] DATE OF BIRTH:  [birth year]  DATE OF CONSULTATION:  03/31/2011  REFERRING PHYSICIAN:  Dr. Letitia LibraJohnston CONSULTING PHYSICIAN:  Lamar BlinksBruce J. Zakyra Kukuk, MD  REASON FOR CONSULTATION: Syncope with hypertension and previous stroke.   CHIEF COMPLAINT:  "I passed out."   HISTORY OF PRESENT ILLNESS: This is a 81 year old female who presents with shortness of breath to the Emergency Room but had also a passing out spell of unknown etiology. The patient has had some difficulty with a history of this issue but she has had some concern of having a possible seizure and passing out. The patient was out for a fair amount of time, but then had a little bit of chest pain and shortness of breath thereafter. The patient on admission had no evidence of acute myocardial infarction. EKG shows normal sinus rhythm with right bundle branch block. She has had a previous history of hypertension, previously controlled on appropriate medications. The patient clearly at this time has no rhythm disturbances by telemetry and is comfortable.   REVIEW OF SYSTEMS:  The remainder of the review of systems is difficult to address due to the patient's difficulty in answering questions.   PAST MEDICAL HISTORY:  1. Hypertension.  2. Hyperlipidemia.  3. Depression.  4. Dementia.  5. Gastroesophageal reflux.  6. Coronary artery disease.   SOCIAL HISTORY: The patient currently denies alcohol or tobacco use.   FAMILY HISTORY: Positive for diabetes mellitus and epilepsy and myocardial infarction and stroke in her father.   CURRENT MEDICATIONS: As listed.   PHYSICAL EXAMINATION:  VITAL SIGNS: Blood pressure 106/55 bilaterally, heart rate 72 upright, reclining, and regular.   GENERAL: She is a well-appearing elderly female in no acute distress.   HEENT: No icterus, thyromegaly, ulcers, hemorrhage, or xanthelasma.   HEART: Regular rate and rhythm with normal S1 and S2 without murmur, gallop,  or rub. Point of maximal impulse is normal size and placement. Carotid upstroke normal without bruit. Jugular venous pressure is normal.   LUNGS: Lungs are clear to auscultation with normal respirations.   ABDOMEN: Soft, nontender, without hepatosplenomegaly or masses. Abdominal aorta is normal size without bruit.   EXTREMITIES: 2+ bilateral pulses in dorsal, pedal, radial, and femoral arteries without lower extremity edema, cyanosis, clubbing, or ulcers.   NEUROLOGIC: She is oriented to time, place, and person with normal mood and affect.   ASSESSMENT: This is a 81 year old female with abnormal EKG, syncopal episode, hypertension, possible stroke/seizure, but no evidence of myocardial infarction.   RECOMMENDATIONS:  1. CT and/or MRI to assess for possible causes of stroke. 2. Further evaluation pulmonary-wise to assess for possible pulmonary causes and hypoxia.  3. Continue serial ECG and enzymes to assess for possible myocardial infarction.  4. Further investigation of rhythm disturbances by telemetry.  5. Consider echocardiogram for LV systolic dysfunction and pulmonary hypertension as contributing to above.  6. Further treatment options after above.   ____________________________ Lamar BlinksBruce J. Jaeley Wiker, MD bjk:bjt D:  04/06/2011 11:50:51 ET          T: 04/06/2011 12:20:31 ET JOB#: [id number]  Lamar BlinksBRUCE J Shauntea Lok MD ELECTRONICALLY SIGNED 05/05/2011 14:27

## 2014-07-12 NOTE — H&P (Signed)
PATIENT NAME:  Morgan Jenkins, Morgan Jenkins MR#:  [id number] DATE OF BIRTH:  04-30-1929  DATE OF ADMISSION:  03/30/2011  PRIMARY CARE PHYSICIAN: Dr. Dossie Arbour. EMERGENCY ROOM PHYSICIAN: Dr. Sharma Covert.  CHIEF COMPLAINT: Shortness of breath.   HISTORY OF PRESENT ILLNESS: The patient is a 81 year old female who presents with chief complaint of shortness of breath. Symptoms began about three days ago. The patient felt weak all over. She had an episode where she developed some chest discomfort. EMS was called on Tuesday and her monitor showed left bundle branch block and she was taken to the Emergency Department. She was admitted at [facility] and she underwent cardiac catheter, which was normal. The patient went home. She continued to have weakness. Today at noon she had an episode where she was shaking and per her granddaughter, who is a nurse she may have had a seizure. There was no tongue biting. She has been having redness over the right ankle.    PAST MEDICAL HISTORY:  1. Hypertension.  2. Hyperlipidemia.  3. Depression.  4. Dementia.  5. Gastroesophageal reflux disease.  6. History of dyspepsia.  7. Neuropathy.   PAST SURGICAL HISTORY:  1. Cardiac catheterization. 2. Lumbar diskectomy. 3. Hysterectomy.  4. Carpal tunnel syndrome. 5. Breast lumpectomy. 6. Lumbar fusion with instrumentation 2005. 7. Left total knee replacement July 2008.   SOCIAL HISTORY: The patient denies tobacco abuse, alcohol abuse or drug abuse.   FAMILY HISTORY: Positive for diabetes, epilepsy in mom. She also had a myocardial infarction. Father had a stroke.   REVIEW OF SYSTEMS: CONSTITUTIONAL: The patient denies any fevers, chills, or night sweats. HEENT: The patient denies any hearing loss, dysphagia, visual problems, or sore throat. CARDIOVASCULAR: The patient denies any chest pain, orthopnea, or PND. RESPIRATORY: The patient denies any cough, wheezing, or hemoptysis. GASTROINTESTINAL: The patient denies any nausea, vomiting,  abdominal pain, hematemesis, hematochezia, or melena. GU: The patient denies any hematuria, dysuria, or frequency. NEUROLOGIC: The patient denies any headache, focal weakness or seizures. SKIN: The patient denies any lesions or rash. ENDOCRINE: The patient denies any polyuria, polyphagia, or polydipsia. MUSCULOSKELETAL: The patient denies any arthritis, joint effusion, or swelling. HEMATOLOGICAL: The patient denies any easy bleeding or bruises.   PHYSICAL EXAMINATION:  VITAL SIGNS: Temperature 99.9, heart rate 76, respiratory rate 20, blood pressure 106/55, oxygen saturation 93%.   HEENT: Atraumatic, normocephalic. Pupils are equal, round, and reactive to light and accommodation. Extraocular movements are intact. Sclerae anicteric. Mucous membranes are moist.   NECK: Supple. No organomegaly.   CARDIOVASCULAR: S1, S2, regular rate, rhythm. No gallops. No thrills. No murmurs.   RESPIRATORY: Lungs are clear to auscultation. No rales, no rhonchi, no wheezes, no bronchial breath sounds.   GI: Abdomen is soft, nontender, nondistended. Normal bowel sounds. No hepatosplenomegaly.   GU: There is no hematuria or masses noted.   SKIN: No lesions, no rash.   ENDOCRINE: No masses, no thyromegaly.   LYMPH: No lymphadenopathy or nodes palpable.   NEUROLOGIC: Cranial nerves II through XII grossly intact. Motor strength is five out of five bilateral upper and lower extremities. Sensation is within normal limits. No focal neurological deficit noted on examination.   MUSCULOSKELETAL: No arthritis, joint effusion, or swelling.   RHEUMATOLOGICAL: No ecchymosis. No bleeding. No petechiae noted.   EXTREMITIES: There is no cyanosis, no clubbing, or edema. 2+ pedal pulses noted bilaterally. There is erythema and warmth around the right ankle. Pedal pulses are normal.   LABORATORY, DIAGNOSTIC, AND RADIOLOGICAL DATA: EKG: Sinus rhythm, 74  beats per minute. CT of the chest shows large hiatal hernia, bilateral  lower lobe areas of atelectasis. Trace bilateral pleural effusions. No evidence of edema or pneumothorax. Stable hyperattenuation area in the left lobe of the liver suggestion of small hemangioma. CT of the brain is negative. WBC count 8,000, hemoglobin 11. Hematocrit 32.7, platelet count 201 and MCV is 88. Glucose 114, BUN 9, creatinine 0.66, sodium 137, potassium 3.6, chloride 94, CO2 35, calcium 9.0, total bilirubin 0.3, alkaline phosphatase 48, ALT 12, AST 12. Total protein is 6.9. Estimated GFR is greater than 60. Total CK 39, CK-MB is less than 0.5. Troponin less than 0.02. TSH 1.88. Urinalysis is normal.   ASSESSMENT AND PLAN:  1. The patient is a 81 year old female who presents with chief complaint of shortness of breath and confusion. CT of the chest shows bilateral lower lobe areas of atelectasis. There is trace bilateral pleural effusions. The patient is hypoxemic, likely the cause of her confusion, unlikely this is seizure. We will start the patient on IV fluids. Start IV Levaquin. Follow blood cultures. Check ABG. ABG results reviewed.  2. Tremor, confusion and question of seizure. Check MRI of brain, EEG. If EEG is negative, unlikely this is seizure.  3. Hypoalbuminemia. Dietary.  4. Anemia. Check iron studies, ferritin, B12, folate. Guaiac stool.  5. Hypertension. Continue metoprolol, lisinopril. 6. Gastroesophageal reflux disease. Continue omeprazole.   7. Hyperlipidemia. Continue Pravachol.  8. Depression. Continue Cymbalta. 9. Dementia. Continue Aricept.   ____________________________ Donia AstJignesh S. Meline Russaw, MD jsp:ap D: 03/30/2011 23:19:42 ET T: 03/31/2011 07:02:51 ET JOB#: [id number]  cc: Donia AstJignesh S. Zein Helbing, MD, <Dictator> Steele SizerMark A. Crissman, MD  Donia AstJIGNESH S Sharyah Bostwick MD ELECTRONICALLY SIGNED 03/31/2011 8:31

## 2014-07-12 NOTE — Consult Note (Signed)
Asked to see patient for carotid disease.  She has multiple ongoing issues including pneumonia and posterior circulation stroke.  She had a carotid dupex done yesterday which reveals moderate (<75%) stenosis bilaterally.  This is in the range that would generally be treated medically, and is likely not the cause of her stroke.  Will need followed ongoing as an outpatient, and this was discussed with patient and family today.  Would recommend anti-platelets and statin agent for medical management and see me in 4-6 weeks in the office.    Electronic Signatures: Annice Needyew, Keli Buehner S (MD)  (Signed on 12-Jan-13 16:01)  Authored  Last Updated: 12-Jan-13 16:01 by Annice Needyew, Leeroy Lovings S (MD)

## 2014-07-12 NOTE — Consult Note (Signed)
Brief Consult Note: Diagnosis: elderly female with syncope and hypoxia with no cad by cath and normal lv function by echo and cerebellar stroke.   Patient was seen by consultant.   Consult note dictated.   Discussed with Attending MD.   Comments: neuro consult for stroke tx of hypoxia tele for rhythm concerns further wu after above.  Electronic Signatures: Lamar BlinksKowalski, Kasi Lasky J (MD)  (Signed 11-Jan-13 22:23)  Authored: Brief Consult Note   Last Updated: 11-Jan-13 22:23 by Lamar BlinksKowalski, Lawrencia Mauney J (MD)

## 2014-07-12 NOTE — Discharge Summary (Signed)
PATIENT NAME:  Morgan Jenkins, [name] MR#:  [id number] DATE OF BIRTH:  14-Apr-1929  DATE OF ADMISSION:  03/30/2011 DATE OF DISCHARGE:  04/03/2011  ADDENDUM:   DISCHARGE MEDICATIONS:  We were about to continue the patient's home medications which include Macrobid; however, as it was unclear if this medication was given for the patient initially for her urinary tract infection therapy or as continuous suppressive therapy for her frequent urinary tract infections, decision was made to discontinue completely this medication and make decisions as the clinical picture requires. So, the patient will be discharged without Macrobid at this point. Her discharge medications are as follows:  1. Tramadol 50 mg q.i.d. as needed.  2. Aricept 5 mg p.o. at bedtime.  3. Cymbalta 30 mg p.o. daily.  4. Lyrica 50 mg p.o. b.i.d.   5. Metoprolol succinate 25 mg p.o. daily. 6. Lisinopril 10 mg p.o. daily.  7. Nitrostat sublingually as needed.  8. Vitamin D3, 1000 units once daily.  9. Lasix 20 mg p.o. as needed.  10. Flector patch 1.3% topical film, 1 patch b.i.d.  to lower back.   ADDITIONAL MEDICATIONS:  1. Lipitor 80 mg p.o. bedtime. 2. Plavix 75 mg p.o. daily.  3. Colace 100 mg p.o. b.i.d. as needed.  4. Milk of magnesia 30 mL p.o. b.i.d. as needed.  5. Protonix 40 mg p.o. daily.  6. Levaquin 500 mg p.o. daily for 3 more days.       7. Prednisone 40 mg p.o. on 04/04/2011, then taper x10 mg daily until stop.   ____________________________ Katharina Caperima Flora Parks, MD rv:cbb D: 04/03/2011 15:41:50 ET T: 04/03/2011 15:53:24 ET JOB#: [id number]  cc: Katharina Caperima Bascom Biel, MD, <Dictator> Herbon E. Meredeth IdeFleming, MD Steele SizerMark A. Crissman, MD Lamar BlinksBruce J. Kowalski, MD Annice NeedyJason S. Dew, MD Royal Vandevoort Winona LegatoVAICKUTE MD ELECTRONICALLY SIGNED 04/17/2011 7:48

## 2014-08-31 ENCOUNTER — Encounter: Payer: Self-pay | Admitting: Family Medicine

## 2014-10-14 IMAGING — CT CT HEAD WITHOUT CONTRAST
1 series · 16 of 30 positions shown, 20 images · non-contrast
Comparison: 04/15/2013

CLINICAL DATA: Altered mental status

EXAM:
CT HEAD WITHOUT CONTRAST
TECHNIQUE: Contiguous axial images were obtained from the base of the skull
through the vertex without intravenous contrast.

[Series 6: head wo · axial · 0.43mm/px · z∈[+181,+321]mm · 16 of 32 slices shown, 20 images]
[im 2/32  brain]
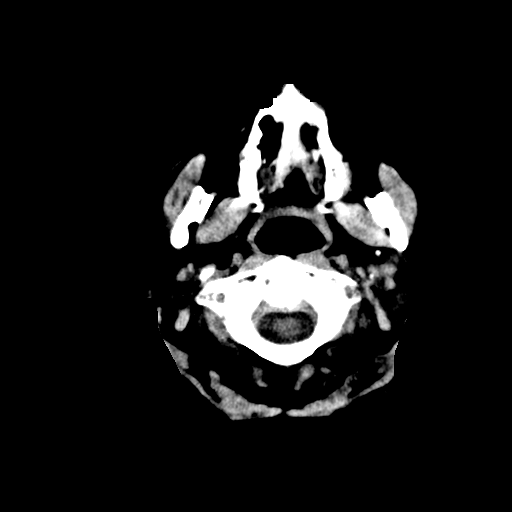
[im 2/32  bone]
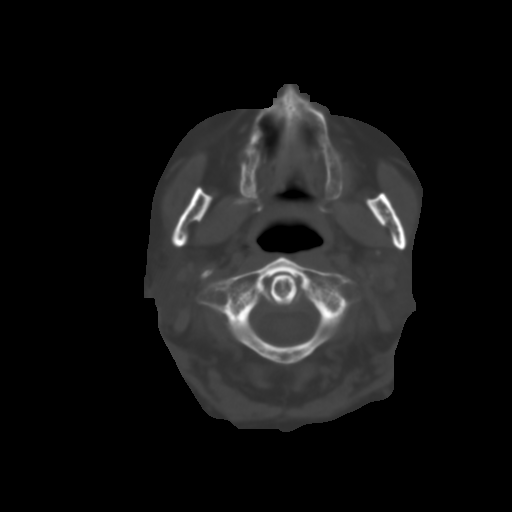
[im 4/32  brain]
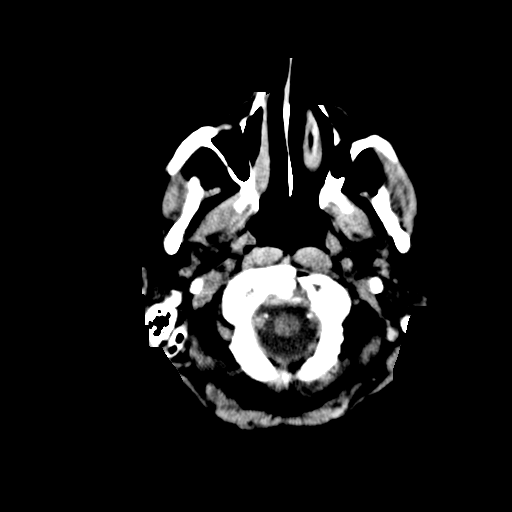
[im 6/32  brain]
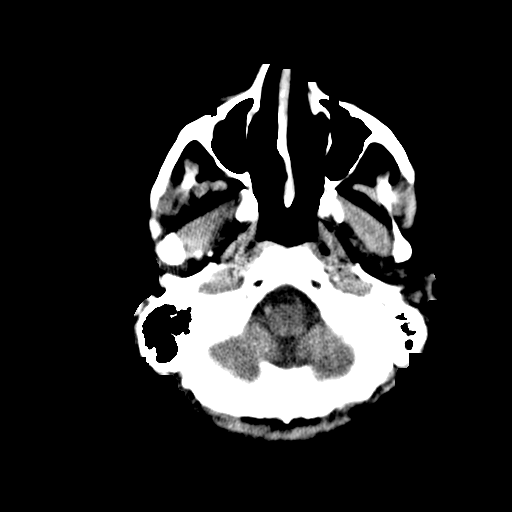
[im 8/32  brain]
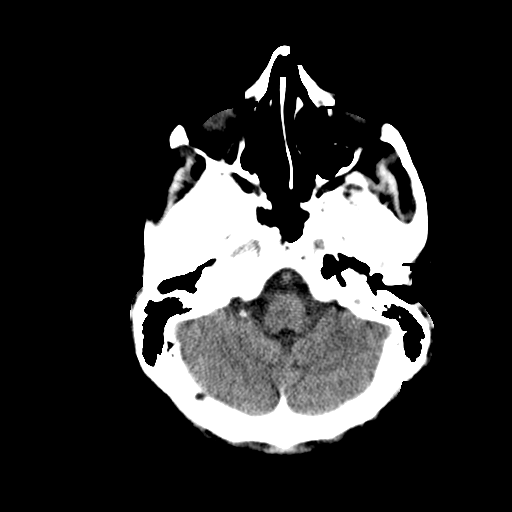
[im 9/32  brain]
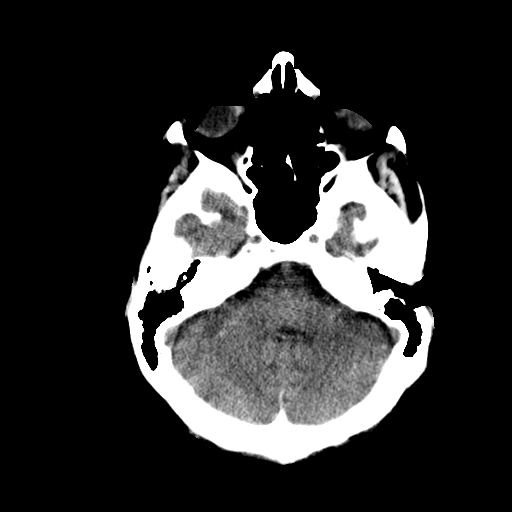
[im 9/32  bone]
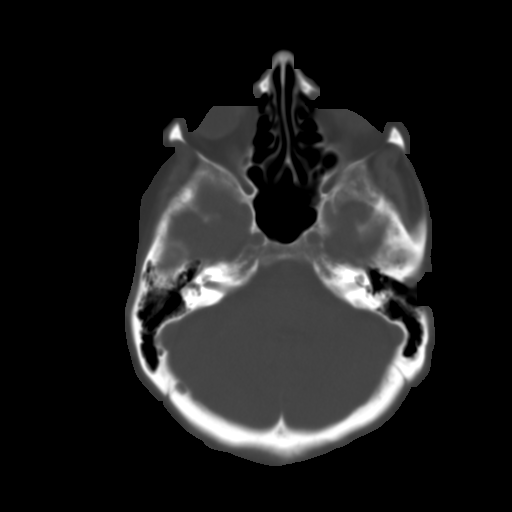
[im 11/32  brain]
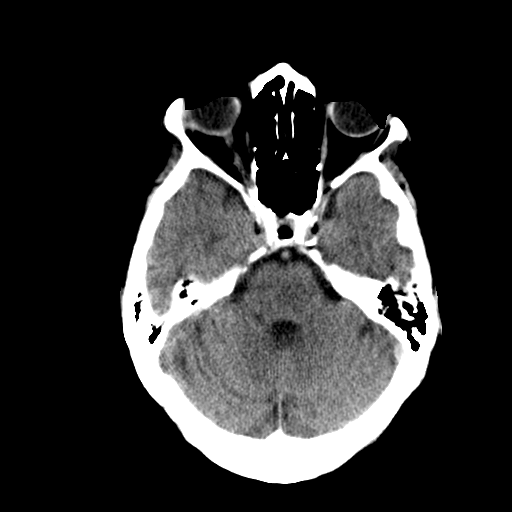
[im 13/32  brain]
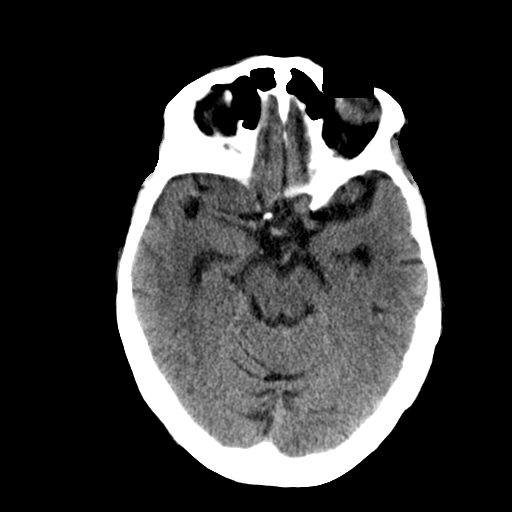
[im 15/32  brain]
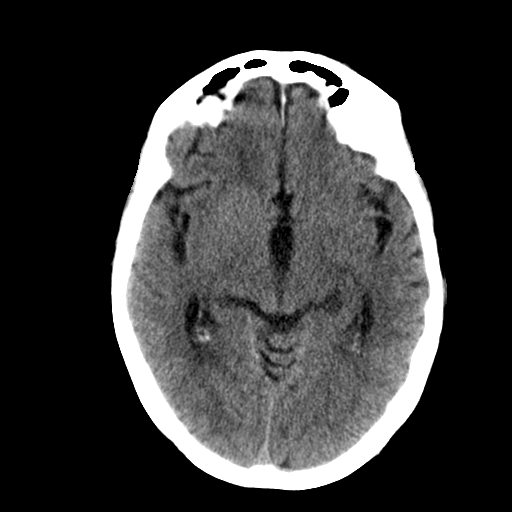
[im 17/32  brain]
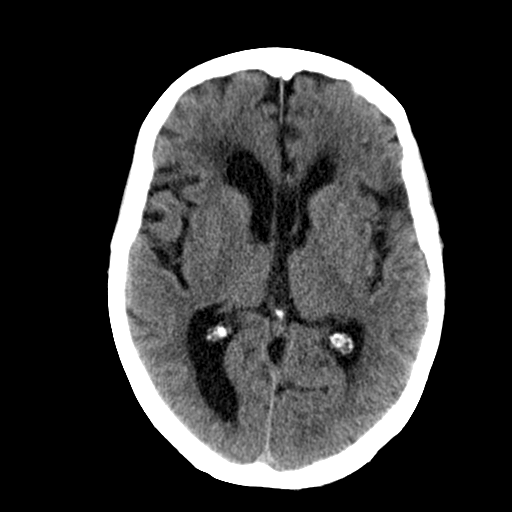
[im 17/32  bone]
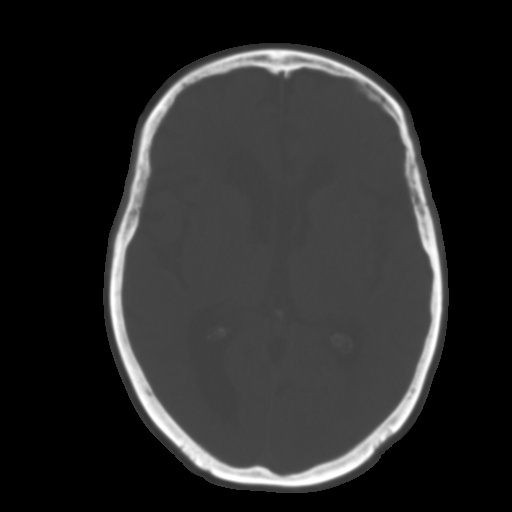
[im 19/32  brain]
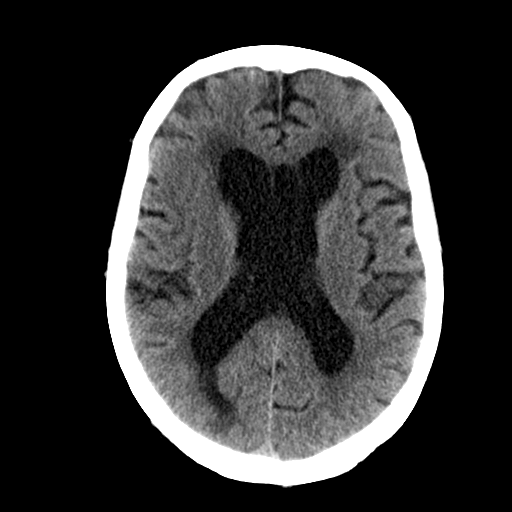
[im 21/32  brain]
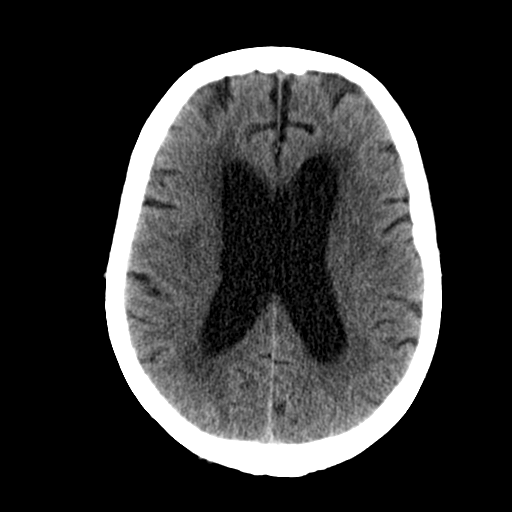
[im 23/32  brain]
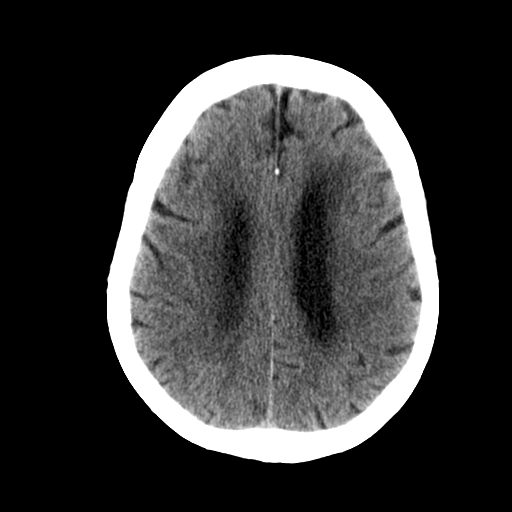
[im 24/32  brain]
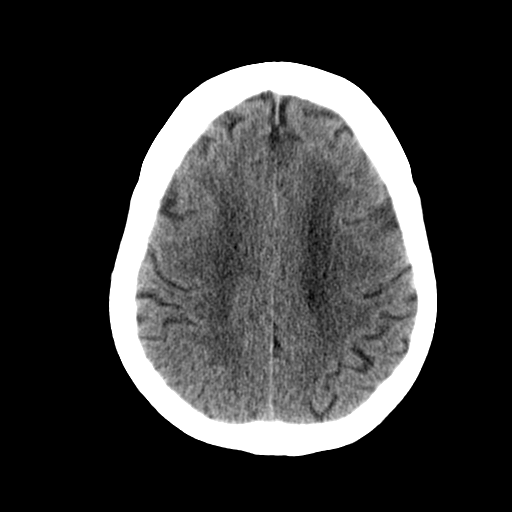
[im 24/32  bone]
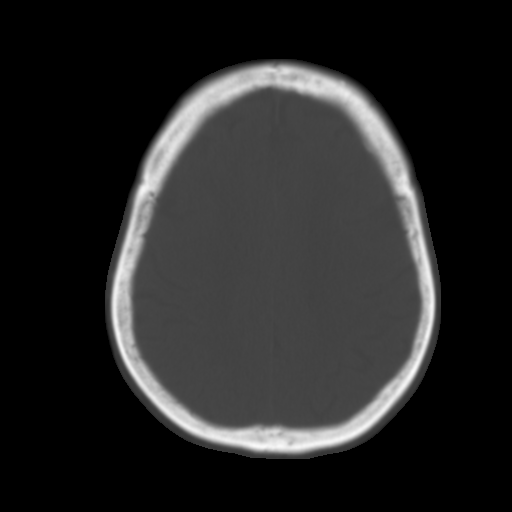
[im 26/32  brain]
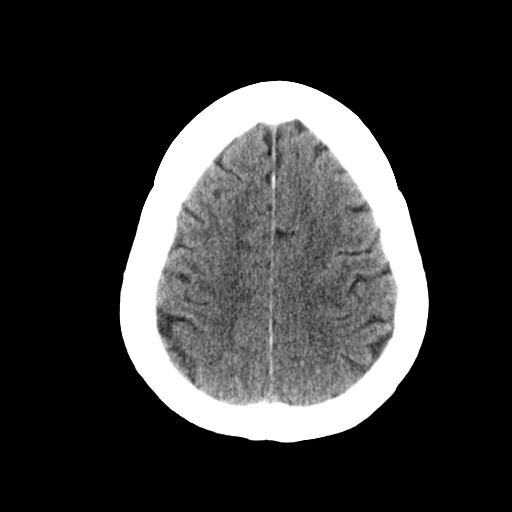
[im 28/32  brain]
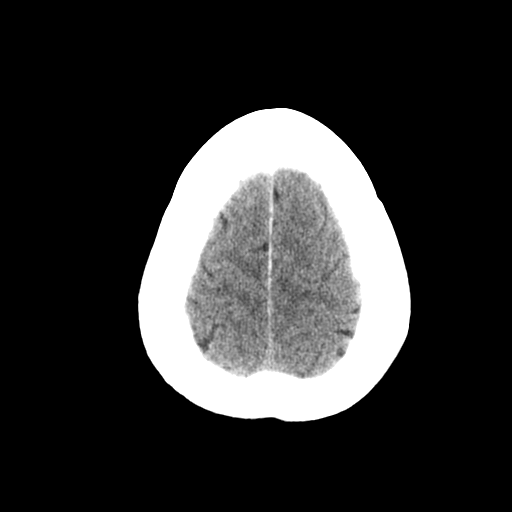
[im 30/32  brain]
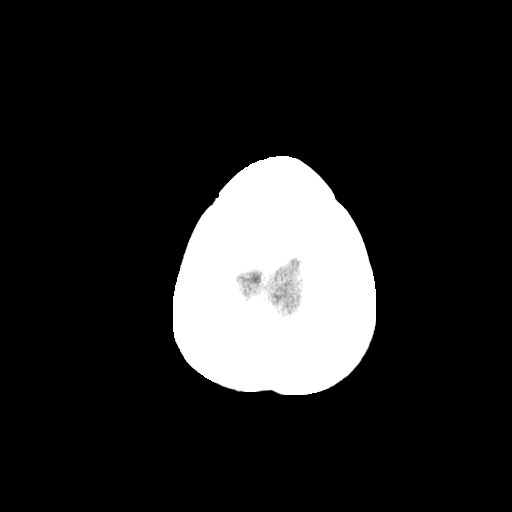

[16 of 30 positions shown; findings below may reference images not displayed]

FINDINGS: The bony calvarium is intact. A cavum septum pellucidum is noted.
Mild chronic white matter ischemic change is seen. The previously
seen subdural hemorrhage along the falx is not well visualized on
this exam. No new focal area of hemorrhage is seen. No infarct or
space-occupying mass lesion is noted.
IMPRESSION: Chronic changes without acute abnormality. There is been resolution
of the previously seen subdural hemorrhage along the falx on the
left.

## 2014-10-20 IMAGING — CR DG CHEST 2V
1 series · 2 of 2 positions shown · non-contrast
Comparison: CT CERVICAL SPINE W/O CM dated 04/15/2013; DG CHEST 1V
PORT dated 04/15/2013

CLINICAL DATA: cough, fever, AMS

EXAM:
CHEST  2 VIEW

[Series 1: x chest ap · 0.14mm/px · 2 of 2 slices shown]
[im 1/2]
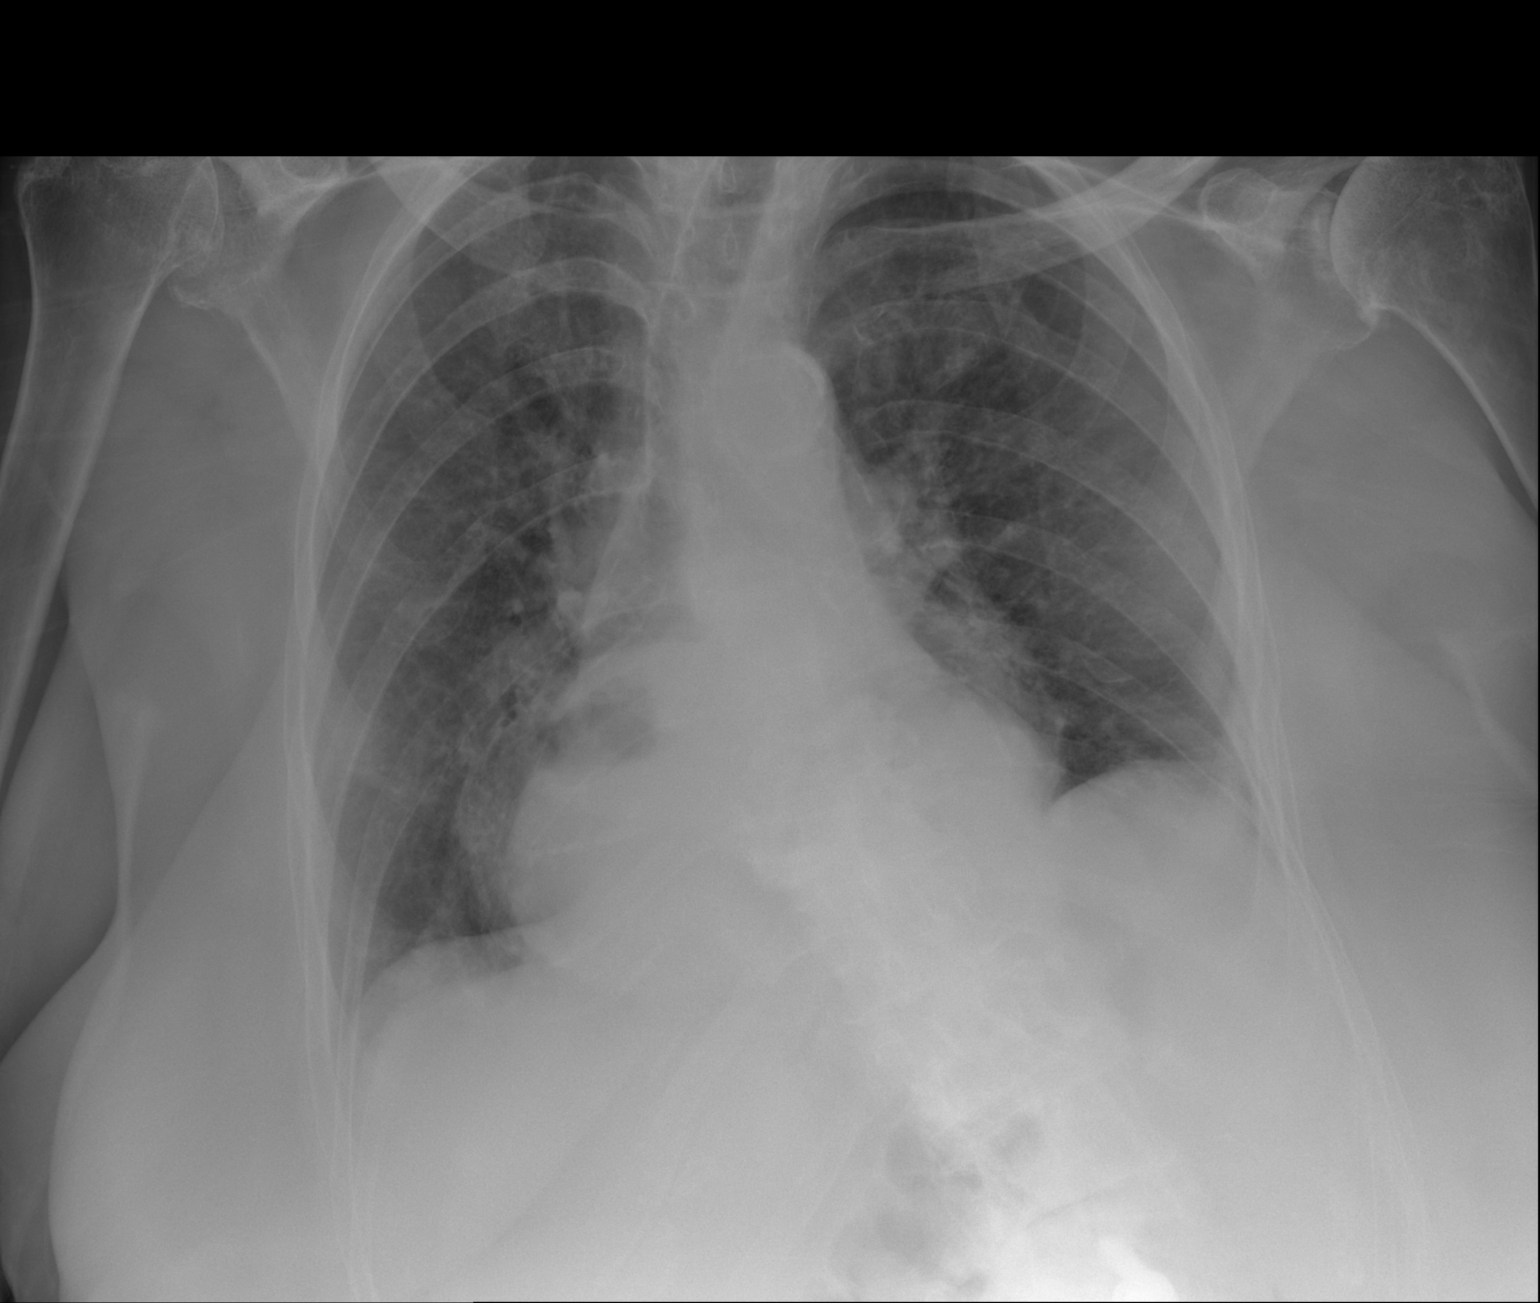
[im 2/2]
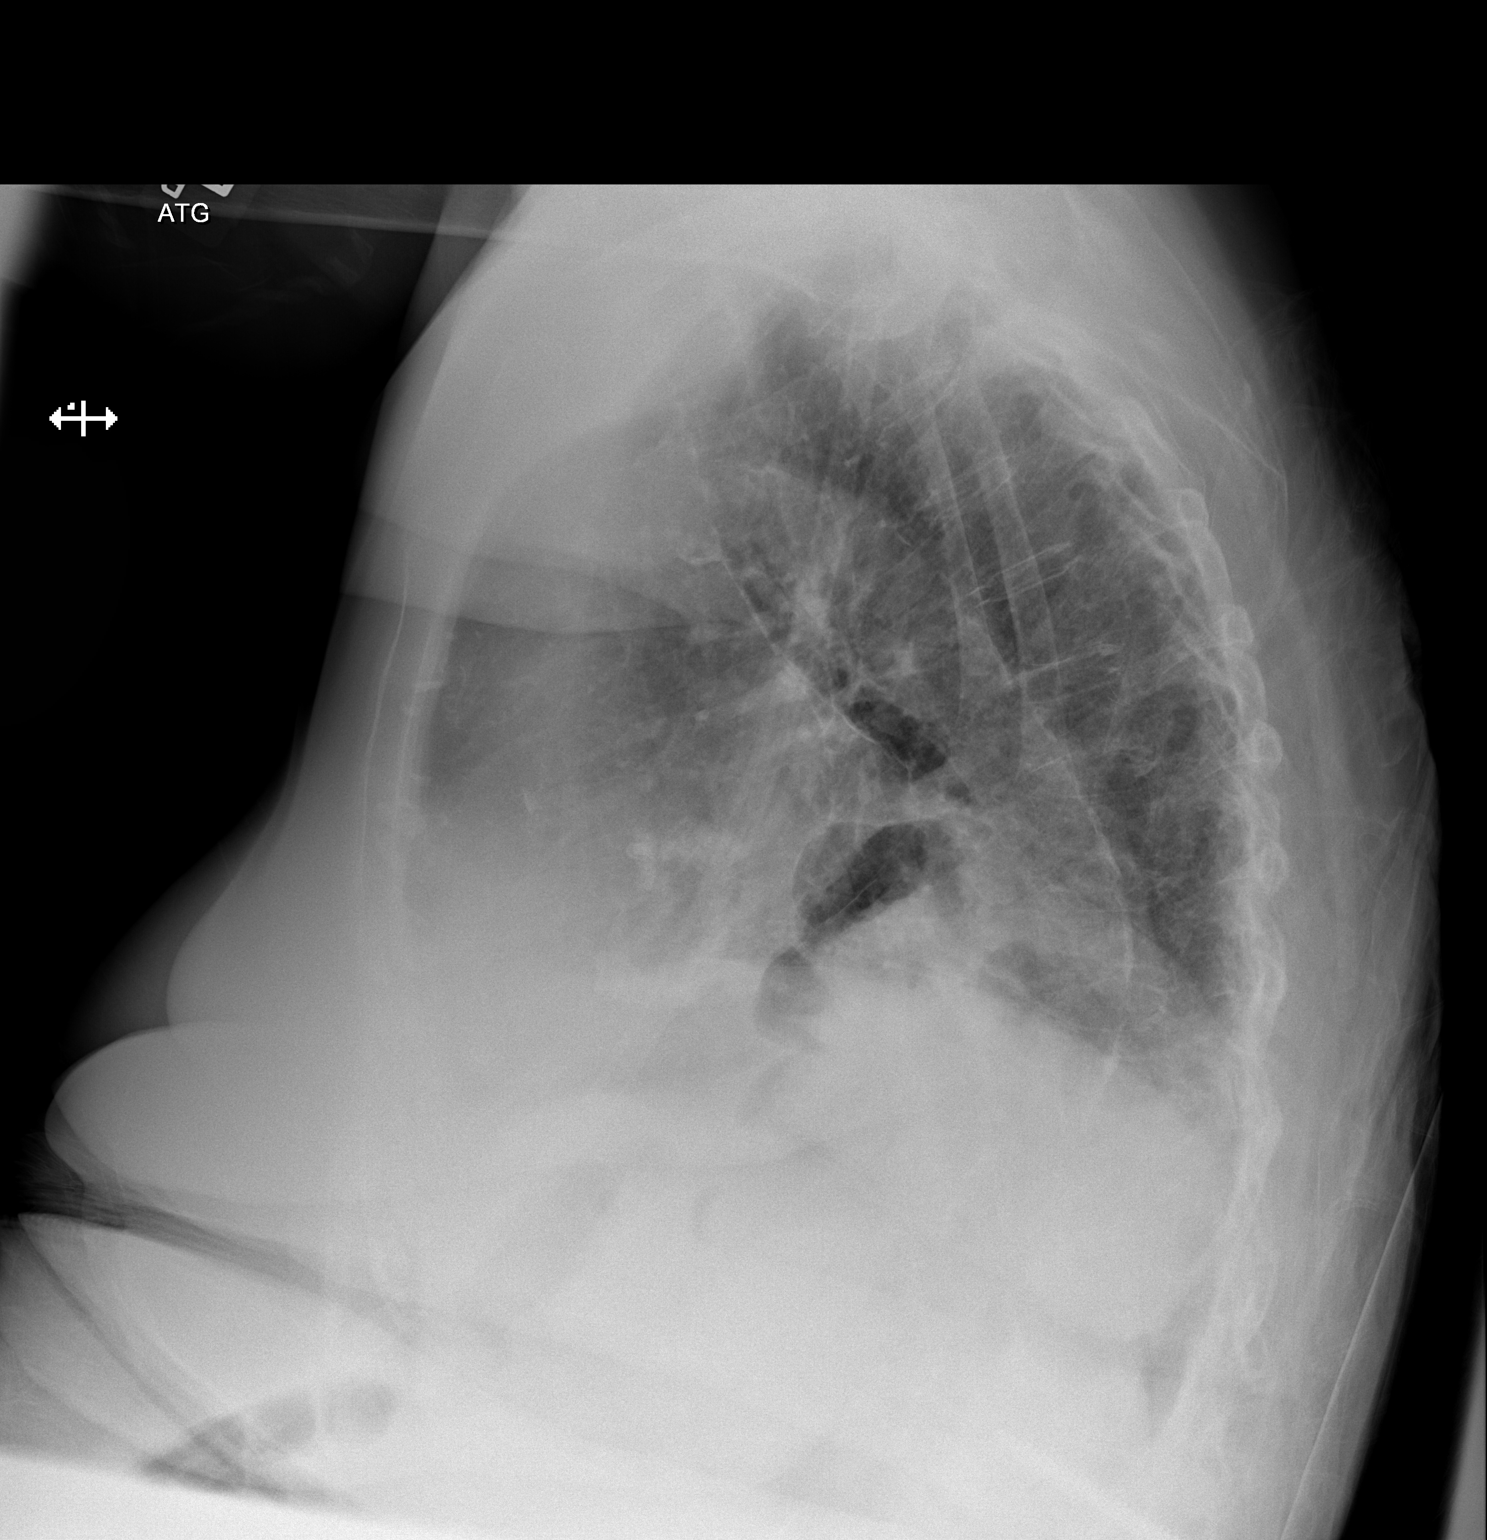

[2 of 2 positions shown; findings below may reference images not displayed]

FINDINGS: Low lung volumes. There is prominence of the interstitial markings,
mild peribronchial cuffing is appreciated. No focal infiltrates
appreciated. An area of increased density projected retrocardiac
region demonstrate an air-fluid level. Atherosclerotic
calcifications appreciated within the aorta. The osseous structures
demonstrate osteopenia, and a S-shaped scoliosis of thoracolumbar
spine. Degenerative changes appreciated within the shoulders.
Chronic changes appreciated along the distal clavicle right, trauma
versus iatrogenic. The cardiac silhouette is enlarged.
IMPRESSION: Mild interstitial infiltrate linear reflect mild pulmonary edema.
Findings consistent with hiatal hernia.
